# Patient Record
Sex: Male | Born: 1985 | Race: Black or African American | Hispanic: No | Marital: Single | Smoking: Never smoker
Health system: Southern US, Community
[De-identification: ages and names within clinical notes are randomized; demographics above are authoritative.]

## PROBLEM LIST (undated history)

## (undated) DIAGNOSIS — K5792 Diverticulitis of intestine, part unspecified, without perforation or abscess without bleeding: Secondary | ICD-10-CM

## (undated) DIAGNOSIS — N200 Calculus of kidney: Secondary | ICD-10-CM

## (undated) DIAGNOSIS — N289 Disorder of kidney and ureter, unspecified: Secondary | ICD-10-CM

## (undated) DIAGNOSIS — K5790 Diverticulosis of intestine, part unspecified, without perforation or abscess without bleeding: Secondary | ICD-10-CM

## (undated) HISTORY — PX: CHOLECYSTECTOMY: SHX55

## (undated) HISTORY — PX: APPENDECTOMY: SHX54

---

## 2008-12-24 ENCOUNTER — Emergency Department (HOSPITAL_COMMUNITY): Admission: EM | Admit: 2008-12-24 | Discharge: 2008-12-25 | Payer: Self-pay | Admitting: Emergency Medicine

## 2009-12-29 ENCOUNTER — Emergency Department (HOSPITAL_COMMUNITY): Admission: EM | Admit: 2009-12-29 | Discharge: 2009-12-30 | Payer: Self-pay | Admitting: Emergency Medicine

## 2010-05-03 ENCOUNTER — Emergency Department (HOSPITAL_COMMUNITY): Admission: EM | Admit: 2010-05-03 | Discharge: 2010-05-03 | Payer: Self-pay | Admitting: Emergency Medicine

## 2010-05-08 ENCOUNTER — Emergency Department (HOSPITAL_COMMUNITY): Admission: EM | Admit: 2010-05-08 | Discharge: 2010-05-09 | Payer: Self-pay | Admitting: Emergency Medicine

## 2010-05-10 ENCOUNTER — Emergency Department (HOSPITAL_COMMUNITY): Admission: EM | Admit: 2010-05-10 | Discharge: 2010-05-10 | Payer: Self-pay | Admitting: Emergency Medicine

## 2010-05-24 ENCOUNTER — Emergency Department (HOSPITAL_COMMUNITY): Admission: EM | Admit: 2010-05-24 | Discharge: 2010-05-24 | Payer: Self-pay | Admitting: Emergency Medicine

## 2010-05-26 ENCOUNTER — Emergency Department: Payer: Self-pay | Admitting: Emergency Medicine

## 2010-05-26 ENCOUNTER — Emergency Department (HOSPITAL_COMMUNITY): Admission: EM | Admit: 2010-05-26 | Discharge: 2010-05-26 | Payer: Self-pay | Admitting: Emergency Medicine

## 2010-06-11 ENCOUNTER — Emergency Department: Payer: Self-pay | Admitting: Emergency Medicine

## 2010-07-13 ENCOUNTER — Emergency Department (HOSPITAL_COMMUNITY): Admission: EM | Admit: 2010-07-13 | Discharge: 2010-07-13 | Payer: Self-pay | Admitting: Emergency Medicine

## 2010-07-19 ENCOUNTER — Emergency Department: Payer: Self-pay | Admitting: Unknown Physician Specialty

## 2010-07-24 ENCOUNTER — Emergency Department (HOSPITAL_COMMUNITY): Admission: EM | Admit: 2010-07-24 | Discharge: 2010-07-24 | Payer: Self-pay | Admitting: Emergency Medicine

## 2010-08-02 ENCOUNTER — Emergency Department (HOSPITAL_COMMUNITY)
Admission: EM | Admit: 2010-08-02 | Discharge: 2010-08-02 | Payer: Self-pay | Source: Home / Self Care | Admitting: Emergency Medicine

## 2010-08-10 ENCOUNTER — Emergency Department (HOSPITAL_COMMUNITY)
Admission: EM | Admit: 2010-08-10 | Discharge: 2010-08-10 | Payer: Self-pay | Source: Home / Self Care | Admitting: Emergency Medicine

## 2010-08-11 ENCOUNTER — Emergency Department (HOSPITAL_COMMUNITY): Admission: EM | Admit: 2010-08-11 | Discharge: 2010-08-11 | Payer: Self-pay | Admitting: Emergency Medicine

## 2010-08-16 ENCOUNTER — Emergency Department (HOSPITAL_COMMUNITY): Admission: EM | Admit: 2010-08-16 | Discharge: 2010-08-16 | Payer: Self-pay | Admitting: Emergency Medicine

## 2010-08-31 ENCOUNTER — Emergency Department: Payer: Self-pay | Admitting: Emergency Medicine

## 2010-09-21 ENCOUNTER — Emergency Department (HOSPITAL_COMMUNITY): Admission: EM | Admit: 2010-09-21 | Discharge: 2010-09-21 | Payer: Self-pay | Admitting: Emergency Medicine

## 2010-10-26 ENCOUNTER — Emergency Department (HOSPITAL_COMMUNITY)
Admission: EM | Admit: 2010-10-26 | Discharge: 2010-10-26 | Payer: Self-pay | Source: Home / Self Care | Admitting: Emergency Medicine

## 2010-11-22 ENCOUNTER — Emergency Department: Payer: Self-pay | Admitting: Emergency Medicine

## 2010-11-28 ENCOUNTER — Emergency Department (HOSPITAL_COMMUNITY)
Admission: EM | Admit: 2010-11-28 | Discharge: 2010-11-28 | Payer: Self-pay | Source: Home / Self Care | Admitting: Emergency Medicine

## 2010-12-04 LAB — DIFFERENTIAL
Basophils Absolute: 0 10*3/uL (ref 0.0–0.1)
Basophils Relative: 0 % (ref 0–1)
Eosinophils Absolute: 0.1 10*3/uL (ref 0.0–0.7)
Eosinophils Relative: 1 % (ref 0–5)
Lymphocytes Relative: 40 % (ref 12–46)
Lymphs Abs: 2.1 10*3/uL (ref 0.7–4.0)
Monocytes Absolute: 0.5 10*3/uL (ref 0.1–1.0)
Monocytes Relative: 9 % (ref 3–12)
Neutro Abs: 2.6 10*3/uL (ref 1.7–7.7)
Neutrophils Relative %: 50 % (ref 43–77)

## 2010-12-04 LAB — COMPREHENSIVE METABOLIC PANEL
ALT: 22 U/L (ref 0–53)
AST: 21 U/L (ref 0–37)
Albumin: 4.2 g/dL (ref 3.5–5.2)
Alkaline Phosphatase: 70 U/L (ref 39–117)
BUN: 12 mg/dL (ref 6–23)
CO2: 24 mEq/L (ref 19–32)
Calcium: 9.4 mg/dL (ref 8.4–10.5)
Chloride: 107 mEq/L (ref 96–112)
Creatinine, Ser: 1.05 mg/dL (ref 0.4–1.5)
GFR calc Af Amer: >60 mL/min (ref >60)
GFR calc non Af Amer: >60 mL/min (ref >60)
Glucose, Bld: 107 mg/dL — ABNORMAL HIGH (ref 70–99)
Potassium: 4 mEq/L (ref 3.5–5.1)
Sodium: 139 mEq/L (ref 135–145)
Total Bilirubin: 0.7 mg/dL (ref 0.3–1.2)
Total Protein: 7.1 g/dL (ref 6.0–8.3)

## 2010-12-04 LAB — CBC
HCT: 40.8 % (ref 39.0–52.0)
Hemoglobin: 13.9 g/dL (ref 13.0–17.0)
MCH: 28.3 pg (ref 26.0–34.0)
MCHC: 34.1 g/dL (ref 30.0–36.0)
MCV: 82.9 fL (ref 78.0–100.0)
Platelets: 232 10*3/uL (ref 150–400)
RBC: 4.92 MIL/uL (ref 4.22–5.81)
RDW: 13 % (ref 11.5–15.5)
WBC: 5.3 10*3/uL (ref 4.0–10.5)

## 2010-12-04 LAB — LIPASE, BLOOD: Lipase: 29 U/L (ref 11–59)

## 2010-12-19 ENCOUNTER — Emergency Department: Payer: Self-pay | Admitting: Internal Medicine

## 2011-01-16 ENCOUNTER — Emergency Department (HOSPITAL_BASED_OUTPATIENT_CLINIC_OR_DEPARTMENT_OTHER)
Admission: EM | Admit: 2011-01-16 | Discharge: 2011-01-16 | Disposition: A | Discharge status: Home / Self Care | Payer: Self-pay | Attending: Emergency Medicine | Admitting: Emergency Medicine

## 2011-01-16 DIAGNOSIS — R10811 Right upper quadrant abdominal tenderness: Secondary | ICD-10-CM | POA: Insufficient documentation

## 2011-01-16 DIAGNOSIS — R1011 Right upper quadrant pain: Secondary | ICD-10-CM | POA: Insufficient documentation

## 2011-01-16 LAB — URINALYSIS, ROUTINE W REFLEX MICROSCOPIC
Bilirubin Urine: NEGATIVE
Hgb urine dipstick: NEGATIVE
Ketones, ur: NEGATIVE mg/dL
Nitrite: NEGATIVE
Protein, ur: NEGATIVE mg/dL
Specific Gravity, Urine: 1.027 (ref 1.005–1.030)
Urine Glucose, Fasting: NEGATIVE mg/dL
Urobilinogen, UA: 1 mg/dL (ref 0.0–1.0)
pH: 6.5 (ref 5.0–8.0)

## 2011-01-16 LAB — COMPREHENSIVE METABOLIC PANEL
ALT: 50 U/L (ref 0–53)
AST: 32 U/L (ref 0–37)
Albumin: 4.8 g/dL (ref 3.5–5.2)
Alkaline Phosphatase: 94 U/L (ref 39–117)
BUN: 16 mg/dL (ref 6–23)
CO2: 28 mEq/L (ref 19–32)
Calcium: 9.9 mg/dL (ref 8.4–10.5)
Chloride: 105 mEq/L (ref 96–112)
Creatinine, Ser: 1.1 mg/dL (ref 0.4–1.5)
GFR calc Af Amer: >60 mL/min (ref >60)
GFR calc non Af Amer: >60 mL/min (ref >60)
Glucose, Bld: 69 mg/dL — ABNORMAL LOW (ref 70–99)
Potassium: 4.1 mEq/L (ref 3.5–5.1)
Sodium: 147 mEq/L — ABNORMAL HIGH (ref 135–145)
Total Bilirubin: 0.9 mg/dL (ref 0.3–1.2)
Total Protein: 8.6 g/dL — ABNORMAL HIGH (ref 6.0–8.3)

## 2011-01-16 LAB — DIFFERENTIAL
Basophils Absolute: 0 10*3/uL (ref 0.0–0.1)
Basophils Relative: 0 % (ref 0–1)
Eosinophils Absolute: 0 10*3/uL (ref 0.0–0.7)
Eosinophils Relative: 0 % (ref 0–5)
Lymphocytes Relative: 21 % (ref 12–46)
Lymphs Abs: 1.6 10*3/uL (ref 0.7–4.0)
Monocytes Absolute: 0.9 10*3/uL (ref 0.1–1.0)
Monocytes Relative: 11 % (ref 3–12)
Neutro Abs: 5.4 10*3/uL (ref 1.7–7.7)
Neutrophils Relative %: 68 % (ref 43–77)

## 2011-01-16 LAB — CBC
HCT: 41.8 % (ref 39.0–52.0)
Hemoglobin: 14.5 g/dL (ref 13.0–17.0)
MCH: 27.9 pg (ref 26.0–34.0)
MCHC: 34.7 g/dL (ref 30.0–36.0)
MCV: 80.4 fL (ref 78.0–100.0)
Platelets: 293 10*3/uL (ref 150–400)
RBC: 5.2 MIL/uL (ref 4.22–5.81)
RDW: 12.6 % (ref 11.5–15.5)
WBC: 7.9 10*3/uL (ref 4.0–10.5)

## 2011-01-16 LAB — LIPASE, BLOOD: Lipase: 204 U/L (ref 23–300)

## 2011-01-30 LAB — DIFFERENTIAL
Basophils Absolute: 0 10*3/uL (ref 0.0–0.1)
Basophils Relative: 1 % (ref 0–1)
Eosinophils Absolute: 0.1 10*3/uL (ref 0.0–0.7)
Eosinophils Relative: 3 % (ref 0–5)
Lymphocytes Relative: 37 % (ref 12–46)
Lymphs Abs: 1.6 10*3/uL (ref 0.7–4.0)
Monocytes Absolute: 0.6 10*3/uL (ref 0.1–1.0)
Monocytes Relative: 14 % — ABNORMAL HIGH (ref 3–12)
Neutro Abs: 1.9 10*3/uL (ref 1.7–7.7)
Neutrophils Relative %: 45 % (ref 43–77)

## 2011-01-30 LAB — CBC
HCT: 42.9 % (ref 39.0–52.0)
Hemoglobin: 14.6 g/dL (ref 13.0–17.0)
MCH: 28.6 pg (ref 26.0–34.0)
MCHC: 34 g/dL (ref 30.0–36.0)
MCV: 84.1 fL (ref 78.0–100.0)
Platelets: 243 10*3/uL (ref 150–400)
RBC: 5.1 MIL/uL (ref 4.22–5.81)
RDW: 12.9 % (ref 11.5–15.5)
WBC: 4.2 10*3/uL (ref 4.0–10.5)

## 2011-01-30 LAB — COMPREHENSIVE METABOLIC PANEL
ALT: 88 U/L — ABNORMAL HIGH (ref 0–53)
AST: 54 U/L — ABNORMAL HIGH (ref 0–37)
Albumin: 4.2 g/dL (ref 3.5–5.2)
Alkaline Phosphatase: 75 U/L (ref 39–117)
BUN: 8 mg/dL (ref 6–23)
CO2: 29 mEq/L (ref 19–32)
Calcium: 9.5 mg/dL (ref 8.4–10.5)
Chloride: 103 mEq/L (ref 96–112)
Creatinine, Ser: 1.04 mg/dL (ref 0.4–1.5)
GFR calc Af Amer: >60 mL/min (ref >60)
GFR calc non Af Amer: >60 mL/min (ref >60)
Glucose, Bld: 88 mg/dL (ref 70–99)
Potassium: 4.3 mEq/L (ref 3.5–5.1)
Sodium: 139 mEq/L (ref 135–145)
Total Bilirubin: 0.8 mg/dL (ref 0.3–1.2)
Total Protein: 7.6 g/dL (ref 6.0–8.3)

## 2011-01-30 LAB — POCT CARDIAC MARKERS
CKMB, poc: <1 ng/mL — ABNORMAL LOW (ref 1.0–8.0)
Myoglobin, poc: 52.3 ng/mL (ref 12–200)

## 2011-01-30 LAB — LIPASE, BLOOD: Lipase: 31 U/L (ref 11–59)

## 2011-02-01 LAB — CBC
HCT: 41.2 % (ref 39.0–52.0)
HCT: 40.4 % (ref 39.0–52.0)
Hemoglobin: 14 g/dL (ref 13.0–17.0)
Hemoglobin: 15 g/dL (ref 13.0–17.0)
Hemoglobin: 13.8 g/dL (ref 13.0–17.0)
Hemoglobin: 14.2 g/dL (ref 13.0–17.0)
MCH: 29.1 pg (ref 26.0–34.0)
MCH: 28.7 pg (ref 26.0–34.0)
MCHC: 34.3 g/dL (ref 30.0–36.0)
MCHC: 34 g/dL (ref 30.0–36.0)
MCHC: 34.2 g/dL (ref 30.0–36.0)
MCV: 83.2 fL (ref 78.0–100.0)
MCV: 85.9 fL (ref 78.0–100.0)
MCV: 84 fL (ref 78.0–100.0)
Platelets: 325 10*3/uL (ref 150–400)
Platelets: 301 10*3/uL (ref 150–400)
Platelets: 205 10*3/uL (ref 150–400)
RBC: 4.95 MIL/uL (ref 4.22–5.81)
RBC: 4.81 MIL/uL (ref 4.22–5.81)
RBC: 4.94 MIL/uL (ref 4.22–5.81)
RDW: 12.8 % (ref 11.5–15.5)
RDW: 13.2 % (ref 11.5–15.5)
RDW: 12.4 % (ref 11.5–15.5)
WBC: 4.7 10*3/uL (ref 4.0–10.5)
WBC: 6.1 10*3/uL (ref 4.0–10.5)

## 2011-02-01 LAB — DIFFERENTIAL
Basophils Absolute: 0 10*3/uL (ref 0.0–0.1)
Basophils Relative: 0 % (ref 0–1)
Basophils Relative: 1 % (ref 0–1)
Eosinophils Absolute: 0.1 10*3/uL (ref 0.0–0.7)
Eosinophils Absolute: 0.1 10*3/uL (ref 0.0–0.7)
Eosinophils Relative: 1 % (ref 0–5)
Eosinophils Relative: 1 % (ref 0–5)
Lymphocytes Relative: 44 % (ref 12–46)
Lymphocytes Relative: 25 % (ref 12–46)
Lymphs Abs: 2 10*3/uL (ref 0.7–4.0)
Lymphs Abs: 1.5 10*3/uL (ref 0.7–4.0)
Lymphs Abs: 1.4 10*3/uL (ref 0.7–4.0)
Lymphs Abs: 1.6 10*3/uL (ref 0.7–4.0)
Monocytes Absolute: 0.4 10*3/uL (ref 0.1–1.0)
Monocytes Relative: 10 % (ref 3–12)
Monocytes Relative: 9 % (ref 3–12)
Neutro Abs: 2.3 10*3/uL (ref 1.7–7.7)
Neutro Abs: 4 10*3/uL (ref 1.7–7.7)
Neutro Abs: 2.2 10*3/uL (ref 1.7–7.7)
Neutrophils Relative %: 49 % (ref 43–77)
Neutrophils Relative %: 51 % (ref 43–77)

## 2011-02-01 LAB — URINALYSIS, ROUTINE W REFLEX MICROSCOPIC
Glucose, UA: NEGATIVE mg/dL
Hgb urine dipstick: NEGATIVE
Hgb urine dipstick: NEGATIVE
Ketones, ur: NEGATIVE mg/dL
Ketones, ur: NEGATIVE mg/dL
Ketones, ur: NEGATIVE mg/dL
Nitrite: NEGATIVE
Nitrite: NEGATIVE
Protein, ur: NEGATIVE mg/dL
Protein, ur: NEGATIVE mg/dL
Protein, ur: NEGATIVE mg/dL
Specific Gravity, Urine: 1.016 (ref 1.005–1.030)
Specific Gravity, Urine: 1.025 (ref 1.005–1.030)
Urobilinogen, UA: 0.2 mg/dL (ref 0.0–1.0)
Urobilinogen, UA: 0.2 mg/dL (ref 0.0–1.0)
pH: 6.5 (ref 5.0–8.0)
pH: 6.5 (ref 5.0–8.0)

## 2011-02-01 LAB — COMPREHENSIVE METABOLIC PANEL
ALT: 54 U/L — ABNORMAL HIGH (ref 0–53)
ALT: 35 U/L (ref 0–53)
AST: 39 U/L — ABNORMAL HIGH (ref 0–37)
AST: 27 U/L (ref 0–37)
AST: 26 U/L (ref 0–37)
Albumin: 4 g/dL (ref 3.5–5.2)
Albumin: 4.1 g/dL (ref 3.5–5.2)
Alkaline Phosphatase: 66 U/L (ref 39–117)
Alkaline Phosphatase: 62 U/L (ref 39–117)
BUN: 12 mg/dL (ref 6–23)
BUN: 9 mg/dL (ref 6–23)
BUN: 11 mg/dL (ref 6–23)
CO2: 26 mEq/L (ref 19–32)
CO2: 29 mEq/L (ref 19–32)
CO2: 25 mEq/L (ref 19–32)
CO2: 26 mEq/L (ref 19–32)
Calcium: 9.6 mg/dL (ref 8.4–10.5)
Calcium: 9.2 mg/dL (ref 8.4–10.5)
Calcium: 9.3 mg/dL (ref 8.4–10.5)
Chloride: 105 mEq/L (ref 96–112)
Chloride: 104 mEq/L (ref 96–112)
Creatinine, Ser: 1.08 mg/dL (ref 0.4–1.5)
GFR calc Af Amer: >60 mL/min (ref >60)
GFR calc Af Amer: >60 mL/min (ref >60)
GFR calc Af Amer: >60 mL/min (ref >60)
GFR calc non Af Amer: >60 mL/min (ref >60)
GFR calc non Af Amer: >60 mL/min (ref >60)
GFR calc non Af Amer: >60 mL/min (ref >60)
GFR calc non Af Amer: >60 mL/min (ref >60)
Glucose, Bld: 98 mg/dL (ref 70–99)
Glucose, Bld: 92 mg/dL (ref 70–99)
Potassium: 4.3 mEq/L (ref 3.5–5.1)
Potassium: 4.2 mEq/L (ref 3.5–5.1)
Sodium: 137 mEq/L (ref 135–145)
Sodium: 139 mEq/L (ref 135–145)
Total Bilirubin: 0.6 mg/dL (ref 0.3–1.2)
Total Bilirubin: 1 mg/dL (ref 0.3–1.2)
Total Protein: 7.4 g/dL (ref 6.0–8.3)
Total Protein: 7.3 g/dL (ref 6.0–8.3)
Total Protein: 7.6 g/dL (ref 6.0–8.3)

## 2011-02-01 LAB — LIPASE, BLOOD
Lipase: 58 U/L (ref 11–59)
Lipase: 29 U/L (ref 11–59)

## 2011-02-01 LAB — RAPID URINE DRUG SCREEN, HOSP PERFORMED
Amphetamines: NOT DETECTED
Barbiturates: NOT DETECTED

## 2011-02-02 LAB — URINALYSIS, ROUTINE W REFLEX MICROSCOPIC
Bilirubin Urine: NEGATIVE
Ketones, ur: NEGATIVE mg/dL
Nitrite: NEGATIVE
Protein, ur: NEGATIVE mg/dL
Urobilinogen, UA: 0.2 mg/dL (ref 0.0–1.0)
pH: 7.5 (ref 5.0–8.0)

## 2011-02-02 LAB — COMPREHENSIVE METABOLIC PANEL
Albumin: 4.2 g/dL (ref 3.5–5.2)
Alkaline Phosphatase: 65 U/L (ref 39–117)
BUN: 10 mg/dL (ref 6–23)
CO2: 30 mEq/L (ref 19–32)
Chloride: 105 mEq/L (ref 96–112)
GFR calc non Af Amer: >60 mL/min (ref >60)
Glucose, Bld: 97 mg/dL (ref 70–99)
Potassium: 4.2 mEq/L (ref 3.5–5.1)
Total Bilirubin: 0.8 mg/dL (ref 0.3–1.2)

## 2011-02-02 LAB — CBC
HCT: 41.5 % (ref 39.0–52.0)
Hemoglobin: 14 g/dL (ref 13.0–17.0)
MCV: 84.5 fL (ref 78.0–100.0)
Platelets: 196 10*3/uL (ref 150–400)
RBC: 4.91 MIL/uL (ref 4.22–5.81)
WBC: 5.1 10*3/uL (ref 4.0–10.5)

## 2011-02-02 LAB — DIFFERENTIAL
Basophils Absolute: 0 10*3/uL (ref 0.0–0.1)
Basophils Relative: 0 % (ref 0–1)
Monocytes Absolute: 0.4 10*3/uL (ref 0.1–1.0)
Neutro Abs: 3.5 10*3/uL (ref 1.7–7.7)

## 2011-02-04 LAB — COMPREHENSIVE METABOLIC PANEL
ALT: 29 U/L (ref 0–53)
ALT: 32 U/L (ref 0–53)
AST: 20 U/L (ref 0–37)
Albumin: 3.9 g/dL (ref 3.5–5.2)
Albumin: 4.1 g/dL (ref 3.5–5.2)
Albumin: 4.3 g/dL (ref 3.5–5.2)
Alkaline Phosphatase: 64 U/L (ref 39–117)
Alkaline Phosphatase: 69 U/L (ref 39–117)
Alkaline Phosphatase: 70 U/L (ref 39–117)
BUN: 10 mg/dL (ref 6–23)
BUN: 12 mg/dL (ref 6–23)
BUN: 7 mg/dL (ref 6–23)
CO2: 31 mEq/L (ref 19–32)
Calcium: 9.2 mg/dL (ref 8.4–10.5)
Calcium: 9.7 mg/dL (ref 8.4–10.5)
Calcium: 9.7 mg/dL (ref 8.4–10.5)
Chloride: 102 mEq/L (ref 96–112)
Creatinine, Ser: 1.11 mg/dL (ref 0.4–1.5)
Creatinine, Ser: 1.17 mg/dL (ref 0.4–1.5)
Creatinine, Ser: 1.2 mg/dL (ref 0.4–1.5)
GFR calc Af Amer: >60 mL/min (ref >60)
GFR calc non Af Amer: >60 mL/min (ref >60)
Glucose, Bld: 91 mg/dL (ref 70–99)
Potassium: 3.8 mEq/L (ref 3.5–5.1)
Potassium: 4.1 mEq/L (ref 3.5–5.1)
Potassium: 4.6 mEq/L (ref 3.5–5.1)
Sodium: 141 mEq/L (ref 135–145)
Total Bilirubin: 0.7 mg/dL (ref 0.3–1.2)
Total Protein: 7 g/dL (ref 6.0–8.3)
Total Protein: 7.5 g/dL (ref 6.0–8.3)
Total Protein: 7.8 g/dL (ref 6.0–8.3)

## 2011-02-04 LAB — POCT CARDIAC MARKERS
Myoglobin, poc: 33.2 ng/mL (ref 12–200)
Myoglobin, poc: 35.6 ng/mL (ref 12–200)
Troponin i, poc: <0.05 ng/mL (ref 0.00–0.09)

## 2011-02-04 LAB — URINALYSIS, ROUTINE W REFLEX MICROSCOPIC
Bilirubin Urine: NEGATIVE
Bilirubin Urine: NEGATIVE
Bilirubin Urine: NEGATIVE
Glucose, UA: NEGATIVE mg/dL
Glucose, UA: NEGATIVE mg/dL
Hgb urine dipstick: NEGATIVE
Hgb urine dipstick: NEGATIVE
Hgb urine dipstick: NEGATIVE
Ketones, ur: NEGATIVE mg/dL
Ketones, ur: NEGATIVE mg/dL
Nitrite: NEGATIVE
Protein, ur: NEGATIVE mg/dL
Protein, ur: NEGATIVE mg/dL
Protein, ur: NEGATIVE mg/dL
Specific Gravity, Urine: 1.023 (ref 1.005–1.030)
Urobilinogen, UA: 1 mg/dL (ref 0.0–1.0)
Urobilinogen, UA: 0.2 mg/dL (ref 0.0–1.0)
pH: 6.5 (ref 5.0–8.0)

## 2011-02-04 LAB — DIFFERENTIAL
Basophils Absolute: 0 10*3/uL (ref 0.0–0.1)
Basophils Absolute: 0 10*3/uL (ref 0.0–0.1)
Basophils Absolute: 0 10*3/uL (ref 0.0–0.1)
Basophils Relative: 0 % (ref 0–1)
Basophils Relative: 0 % (ref 0–1)
Basophils Relative: 1 % (ref 0–1)
Eosinophils Absolute: 0.1 10*3/uL (ref 0.0–0.7)
Eosinophils Relative: 3 % (ref 0–5)
Lymphocytes Relative: 20 % (ref 12–46)
Lymphocytes Relative: 33 % (ref 12–46)
Lymphocytes Relative: 41 % (ref 12–46)
Lymphs Abs: 1.8 10*3/uL (ref 0.7–4.0)
Monocytes Absolute: 0.4 10*3/uL (ref 0.1–1.0)
Monocytes Absolute: 0.7 10*3/uL (ref 0.1–1.0)
Monocytes Relative: 10 % (ref 3–12)
Monocytes Relative: 14 % — ABNORMAL HIGH (ref 3–12)
Monocytes Relative: 8 % (ref 3–12)
Neutro Abs: 1.8 10*3/uL (ref 1.7–7.7)
Neutro Abs: 2.2 10*3/uL (ref 1.7–7.7)
Neutro Abs: 4.5 10*3/uL (ref 1.7–7.7)
Neutrophils Relative %: 46 % (ref 43–77)
Neutrophils Relative %: 72 % (ref 43–77)

## 2011-02-04 LAB — CBC
HCT: 43.8 % (ref 39.0–52.0)
Hemoglobin: 14.2 g/dL (ref 13.0–17.0)
MCH: 28.8 pg (ref 26.0–34.0)
MCHC: 32.4 g/dL (ref 30.0–36.0)
MCHC: 34 g/dL (ref 30.0–36.0)
MCV: 85.8 fL (ref 78.0–100.0)
MCV: 86.1 fL (ref 78.0–100.0)
MCV: 86.3 fL (ref 78.0–100.0)
Platelets: 223 10*3/uL (ref 150–400)
Platelets: 225 10*3/uL (ref 150–400)
Platelets: 234 10*3/uL (ref 150–400)
RBC: 5.2 MIL/uL (ref 4.22–5.81)
RDW: 12.9 % (ref 11.5–15.5)
RDW: 12.9 % (ref 11.5–15.5)
RDW: 13.2 % (ref 11.5–15.5)
WBC: 3.9 10*3/uL — ABNORMAL LOW (ref 4.0–10.5)
WBC: 4.7 10*3/uL (ref 4.0–10.5)

## 2011-02-07 LAB — DIFFERENTIAL
Basophils Relative: 1 % (ref 0–1)
Eosinophils Absolute: 0 10*3/uL (ref 0.0–0.7)
Eosinophils Relative: 1 % (ref 0–5)
Lymphs Abs: 2.1 10*3/uL (ref 0.7–4.0)
Monocytes Absolute: 0.5 10*3/uL (ref 0.1–1.0)
Monocytes Relative: 9 % (ref 3–12)

## 2011-02-07 LAB — URINALYSIS, ROUTINE W REFLEX MICROSCOPIC
Glucose, UA: NEGATIVE mg/dL
Hgb urine dipstick: NEGATIVE
Ketones, ur: NEGATIVE mg/dL
pH: 6 (ref 5.0–8.0)

## 2011-02-07 LAB — BASIC METABOLIC PANEL
CO2: 30 mEq/L (ref 19–32)
Chloride: 105 mEq/L (ref 96–112)
GFR calc Af Amer: >60 mL/min (ref >60)
Potassium: 3.8 mEq/L (ref 3.5–5.1)

## 2011-02-07 LAB — CBC
HCT: 43.3 % (ref 39.0–52.0)
MCHC: 34.3 g/dL (ref 30.0–36.0)
MCV: 85.7 fL (ref 78.0–100.0)
RBC: 5.05 MIL/uL (ref 4.22–5.81)
WBC: 5.2 10*3/uL (ref 4.0–10.5)

## 2011-03-02 ENCOUNTER — Emergency Department (HOSPITAL_COMMUNITY)
Admission: EM | Admit: 2011-03-02 | Discharge: 2011-03-02 | Disposition: A | Discharge status: Home / Self Care | Payer: PRIVATE HEALTH INSURANCE | Attending: Emergency Medicine | Admitting: Emergency Medicine

## 2011-03-02 ENCOUNTER — Emergency Department (HOSPITAL_COMMUNITY): Payer: PRIVATE HEALTH INSURANCE

## 2011-03-02 DIAGNOSIS — R0789 Other chest pain: Secondary | ICD-10-CM | POA: Insufficient documentation

## 2011-03-02 DIAGNOSIS — R1011 Right upper quadrant pain: Secondary | ICD-10-CM | POA: Insufficient documentation

## 2011-03-02 LAB — COMPREHENSIVE METABOLIC PANEL
AST: 38 U/L — ABNORMAL HIGH (ref 0–37)
CO2: 27 mEq/L (ref 19–32)
Calcium: 8.8 mg/dL (ref 8.4–10.5)
Creatinine, Ser: 1.11 mg/dL (ref 0.4–1.5)
GFR calc Af Amer: >60 mL/min (ref >60)
GFR calc non Af Amer: >60 mL/min (ref >60)
Total Protein: 7.4 g/dL (ref 6.0–8.3)

## 2011-03-02 LAB — DIFFERENTIAL
Basophils Relative: 1 % (ref 0–1)
Eosinophils Absolute: 0 10*3/uL (ref 0.0–0.7)
Eosinophils Relative: 1 % (ref 0–5)
Monocytes Absolute: 0.4 10*3/uL (ref 0.1–1.0)
Monocytes Relative: 9 % (ref 3–12)

## 2011-03-02 LAB — CBC
Hemoglobin: 14.2 g/dL (ref 13.0–17.0)
MCH: 28.5 pg (ref 26.0–34.0)
MCHC: 33.6 g/dL (ref 30.0–36.0)
Platelets: 263 10*3/uL (ref 150–400)
RDW: 13.5 % (ref 11.5–15.5)

## 2011-03-02 LAB — LIPASE, BLOOD: Lipase: 35 U/L (ref 11–59)

## 2011-03-06 ENCOUNTER — Emergency Department (HOSPITAL_COMMUNITY)
Admission: EM | Admit: 2011-03-06 | Discharge: 2011-03-06 | Disposition: A | Discharge status: Home / Self Care | Payer: PRIVATE HEALTH INSURANCE | Attending: Emergency Medicine | Admitting: Emergency Medicine

## 2011-03-06 ENCOUNTER — Emergency Department: Payer: Self-pay | Admitting: Unknown Physician Specialty

## 2011-03-06 DIAGNOSIS — R1031 Right lower quadrant pain: Secondary | ICD-10-CM | POA: Insufficient documentation

## 2011-03-06 DIAGNOSIS — N390 Urinary tract infection, site not specified: Secondary | ICD-10-CM | POA: Insufficient documentation

## 2011-03-06 LAB — URINE MICROSCOPIC-ADD ON

## 2011-03-06 LAB — DIFFERENTIAL
Basophils Absolute: 0 10*3/uL (ref 0.0–0.1)
Basophils Absolute: 0 10*3/uL (ref 0.0–0.1)
Eosinophils Absolute: 0.1 10*3/uL (ref 0.0–0.7)
Lymphocytes Relative: 45 % (ref 12–46)
Lymphs Abs: 1.8 10*3/uL (ref 0.7–4.0)
Monocytes Absolute: 0.3 10*3/uL (ref 0.1–1.0)
Neutro Abs: 1.9 10*3/uL (ref 1.7–7.7)
Neutrophils Relative %: 55 % (ref 43–77)

## 2011-03-06 LAB — BASIC METABOLIC PANEL
CO2: 28 mEq/L (ref 19–32)
Glucose, Bld: 99 mg/dL (ref 70–99)
Potassium: 3.8 mEq/L (ref 3.5–5.1)
Sodium: 139 mEq/L (ref 135–145)

## 2011-03-06 LAB — CBC
HCT: 44.9 % (ref 39.0–52.0)
Hemoglobin: 15 g/dL (ref 13.0–17.0)
MCV: 86 fL (ref 78.0–100.0)
Platelets: 254 10*3/uL (ref 150–400)
RDW: 13.1 % (ref 11.5–15.5)
WBC: 4.1 10*3/uL (ref 4.0–10.5)
WBC: 5.1 10*3/uL (ref 4.0–10.5)

## 2011-03-06 LAB — URINALYSIS, ROUTINE W REFLEX MICROSCOPIC
Bilirubin Urine: NEGATIVE
Hgb urine dipstick: NEGATIVE
Hgb urine dipstick: NEGATIVE
Protein, ur: NEGATIVE mg/dL
Specific Gravity, Urine: 1.021 (ref 1.005–1.030)
Urobilinogen, UA: 1 mg/dL (ref 0.0–1.0)
pH: 6.5 (ref 5.0–8.0)

## 2011-03-06 LAB — POCT I-STAT, CHEM 8
Creatinine, Ser: 1.2 mg/dL (ref 0.4–1.5)
HCT: 52 % (ref 39.0–52.0)
Hemoglobin: 17.7 g/dL — ABNORMAL HIGH (ref 13.0–17.0)
Potassium: 4 mEq/L (ref 3.5–5.1)
Sodium: 140 mEq/L (ref 135–145)

## 2011-03-20 ENCOUNTER — Emergency Department (HOSPITAL_BASED_OUTPATIENT_CLINIC_OR_DEPARTMENT_OTHER)
Admission: EM | Admit: 2011-03-20 | Discharge: 2011-03-20 | Disposition: A | Discharge status: Home / Self Care | Payer: PRIVATE HEALTH INSURANCE | Attending: Emergency Medicine | Admitting: Emergency Medicine

## 2011-03-20 ENCOUNTER — Emergency Department (INDEPENDENT_AMBULATORY_CARE_PROVIDER_SITE_OTHER): Payer: PRIVATE HEALTH INSURANCE

## 2011-03-20 DIAGNOSIS — R0789 Other chest pain: Secondary | ICD-10-CM | POA: Insufficient documentation

## 2011-03-20 DIAGNOSIS — R079 Chest pain, unspecified: Secondary | ICD-10-CM

## 2011-03-20 DIAGNOSIS — R0602 Shortness of breath: Secondary | ICD-10-CM

## 2011-04-20 ENCOUNTER — Emergency Department (HOSPITAL_BASED_OUTPATIENT_CLINIC_OR_DEPARTMENT_OTHER)
Admission: EM | Admit: 2011-04-20 | Discharge: 2011-04-20 | Discharge status: Against Medical Advice | Payer: Self-pay | Attending: Emergency Medicine | Admitting: Emergency Medicine

## 2011-04-20 DIAGNOSIS — R109 Unspecified abdominal pain: Secondary | ICD-10-CM | POA: Insufficient documentation

## 2011-05-01 ENCOUNTER — Emergency Department: Payer: Self-pay | Admitting: Emergency Medicine

## 2011-05-02 ENCOUNTER — Emergency Department (HOSPITAL_COMMUNITY)
Admission: EM | Admit: 2011-05-02 | Discharge: 2011-05-02 | Disposition: A | Discharge status: Home / Self Care | Payer: PRIVATE HEALTH INSURANCE | Attending: Emergency Medicine | Admitting: Emergency Medicine

## 2011-05-02 DIAGNOSIS — R1012 Left upper quadrant pain: Secondary | ICD-10-CM | POA: Insufficient documentation

## 2011-05-02 DIAGNOSIS — R112 Nausea with vomiting, unspecified: Secondary | ICD-10-CM | POA: Insufficient documentation

## 2011-05-02 DIAGNOSIS — R071 Chest pain on breathing: Secondary | ICD-10-CM | POA: Insufficient documentation

## 2011-05-02 LAB — HEPATIC FUNCTION PANEL
ALT: 38 U/L (ref 0–53)
AST: 28 U/L (ref 0–37)
Alkaline Phosphatase: 97 U/L (ref 39–117)
Bilirubin, Direct: 0.1 mg/dL (ref 0.0–0.3)
Indirect Bilirubin: 0.2 mg/dL — ABNORMAL LOW (ref 0.3–0.9)

## 2011-05-02 LAB — URINALYSIS, ROUTINE W REFLEX MICROSCOPIC
Glucose, UA: NEGATIVE mg/dL
Hgb urine dipstick: NEGATIVE
Leukocytes, UA: NEGATIVE
Specific Gravity, Urine: 1.025 (ref 1.005–1.030)
Urobilinogen, UA: 0.2 mg/dL (ref 0.0–1.0)

## 2011-05-02 LAB — BASIC METABOLIC PANEL
BUN: 15 mg/dL (ref 6–23)
Chloride: 100 mEq/L (ref 96–112)
Glucose, Bld: 88 mg/dL (ref 70–99)
Potassium: 4.1 mEq/L (ref 3.5–5.1)
Sodium: 138 mEq/L (ref 135–145)

## 2011-05-02 LAB — CBC
HCT: 42.2 % (ref 39.0–52.0)
Hemoglobin: 14.1 g/dL (ref 13.0–17.0)
RBC: 5.07 MIL/uL (ref 4.22–5.81)
WBC: 5 10*3/uL (ref 4.0–10.5)

## 2011-05-02 LAB — DIFFERENTIAL
Basophils Absolute: 0 10*3/uL (ref 0.0–0.1)
Basophils Relative: 0 % (ref 0–1)
Lymphocytes Relative: 50 % — ABNORMAL HIGH (ref 12–46)
Neutro Abs: 2 10*3/uL (ref 1.7–7.7)
Neutrophils Relative %: 39 % — ABNORMAL LOW (ref 43–77)

## 2011-05-02 LAB — RAPID URINE DRUG SCREEN, HOSP PERFORMED
Benzodiazepines: NOT DETECTED
Cocaine: NOT DETECTED
Opiates: POSITIVE — AB

## 2011-06-13 ENCOUNTER — Emergency Department (HOSPITAL_COMMUNITY): Payer: PRIVATE HEALTH INSURANCE

## 2011-06-13 ENCOUNTER — Emergency Department (HOSPITAL_COMMUNITY)
Admission: EM | Admit: 2011-06-13 | Discharge: 2011-06-13 | Disposition: A | Discharge status: Home / Self Care | Payer: PRIVATE HEALTH INSURANCE | Attending: Emergency Medicine | Admitting: Emergency Medicine

## 2011-06-13 DIAGNOSIS — R109 Unspecified abdominal pain: Secondary | ICD-10-CM | POA: Insufficient documentation

## 2011-06-13 DIAGNOSIS — R112 Nausea with vomiting, unspecified: Secondary | ICD-10-CM | POA: Insufficient documentation

## 2011-06-13 LAB — URINALYSIS, ROUTINE W REFLEX MICROSCOPIC
Bilirubin Urine: NEGATIVE
Leukocytes, UA: NEGATIVE
Nitrite: NEGATIVE
Specific Gravity, Urine: 1.028 (ref 1.005–1.030)
Urobilinogen, UA: 0.2 mg/dL (ref 0.0–1.0)
pH: 6.5 (ref 5.0–8.0)

## 2011-06-13 LAB — COMPREHENSIVE METABOLIC PANEL
AST: 22 U/L (ref 0–37)
Albumin: 4.1 g/dL (ref 3.5–5.2)
BUN: 12 mg/dL (ref 6–23)
Chloride: 102 mEq/L (ref 96–112)
Creatinine, Ser: 1.04 mg/dL (ref 0.50–1.35)
Potassium: 3.7 mEq/L (ref 3.5–5.1)
Total Bilirubin: 0.3 mg/dL (ref 0.3–1.2)
Total Protein: 7.4 g/dL (ref 6.0–8.3)

## 2011-06-13 LAB — DIFFERENTIAL
Basophils Absolute: 0 10*3/uL (ref 0.0–0.1)
Eosinophils Absolute: 0.1 10*3/uL (ref 0.0–0.7)
Eosinophils Relative: 2 % (ref 0–5)
Monocytes Absolute: 0.3 10*3/uL (ref 0.1–1.0)

## 2011-06-13 LAB — CBC
MCHC: 34.7 g/dL (ref 30.0–36.0)
MCV: 82.2 fL (ref 78.0–100.0)
Platelets: 250 10*3/uL (ref 150–400)
RDW: 13.5 % (ref 11.5–15.5)
WBC: 3.6 10*3/uL — ABNORMAL LOW (ref 4.0–10.5)

## 2011-06-13 LAB — PROTIME-INR
INR: 1.02 (ref 0.00–1.49)
Prothrombin Time: 13.6 seconds (ref 11.6–15.2)

## 2011-06-13 LAB — LIPASE, BLOOD: Lipase: 43 U/L (ref 11–59)

## 2011-06-13 MED ORDER — IOHEXOL 300 MG/ML  SOLN
80.0000 mL | Freq: Once | INTRAMUSCULAR | Status: AC | PRN
  Administered 2011-06-13: 80 mL via INTRAVENOUS

## 2011-07-09 ENCOUNTER — Emergency Department: Payer: Self-pay | Admitting: Emergency Medicine

## 2011-07-20 ENCOUNTER — Encounter (HOSPITAL_COMMUNITY): Payer: Self-pay | Admitting: Radiology

## 2011-07-20 ENCOUNTER — Emergency Department (HOSPITAL_COMMUNITY)
Admission: EM | Admit: 2011-07-20 | Discharge: 2011-07-20 | Disposition: A | Discharge status: Home / Self Care | Payer: Self-pay | Attending: Emergency Medicine | Admitting: Emergency Medicine

## 2011-07-20 ENCOUNTER — Emergency Department (HOSPITAL_COMMUNITY): Payer: Self-pay

## 2011-07-20 DIAGNOSIS — R109 Unspecified abdominal pain: Secondary | ICD-10-CM | POA: Insufficient documentation

## 2011-07-20 DIAGNOSIS — N200 Calculus of kidney: Secondary | ICD-10-CM | POA: Insufficient documentation

## 2011-07-20 LAB — URINALYSIS, ROUTINE W REFLEX MICROSCOPIC
Glucose, UA: NEGATIVE mg/dL
Ketones, ur: NEGATIVE mg/dL
Leukocytes, UA: NEGATIVE
Nitrite: NEGATIVE
Protein, ur: NEGATIVE mg/dL
Urobilinogen, UA: 1 mg/dL (ref 0.0–1.0)

## 2011-07-31 ENCOUNTER — Emergency Department (HOSPITAL_COMMUNITY)
Admission: EM | Admit: 2011-07-31 | Discharge: 2011-08-01 | Discharge status: Against Medical Advice | Payer: BC Managed Care – PPO | Attending: Emergency Medicine | Admitting: Emergency Medicine

## 2011-07-31 DIAGNOSIS — R109 Unspecified abdominal pain: Secondary | ICD-10-CM | POA: Insufficient documentation

## 2011-07-31 DIAGNOSIS — R1110 Vomiting, unspecified: Secondary | ICD-10-CM | POA: Insufficient documentation

## 2011-07-31 DIAGNOSIS — R079 Chest pain, unspecified: Secondary | ICD-10-CM | POA: Insufficient documentation

## 2011-08-01 ENCOUNTER — Emergency Department (HOSPITAL_COMMUNITY)
Admission: EM | Admit: 2011-08-01 | Discharge: 2011-08-01 | Discharge status: Against Medical Advice | Payer: BC Managed Care – PPO | Attending: Emergency Medicine | Admitting: Emergency Medicine

## 2011-08-01 ENCOUNTER — Emergency Department: Payer: Self-pay | Admitting: Emergency Medicine

## 2011-08-01 ENCOUNTER — Emergency Department (HOSPITAL_COMMUNITY): Payer: BC Managed Care – PPO

## 2011-08-01 DIAGNOSIS — R112 Nausea with vomiting, unspecified: Secondary | ICD-10-CM | POA: Insufficient documentation

## 2011-08-01 DIAGNOSIS — R1032 Left lower quadrant pain: Secondary | ICD-10-CM | POA: Insufficient documentation

## 2011-08-01 LAB — CBC
MCV: 83.7 fL (ref 78.0–100.0)
Platelets: 238 10*3/uL (ref 150–400)
RBC: 4.85 MIL/uL (ref 4.22–5.81)
RDW: 13.5 % (ref 11.5–15.5)
WBC: 3.3 10*3/uL — ABNORMAL LOW (ref 4.0–10.5)

## 2011-08-01 LAB — DIFFERENTIAL
Basophils Relative: 0 % (ref 0–1)
Eosinophils Absolute: 0.1 10*3/uL (ref 0.0–0.7)
Eosinophils Relative: 2 % (ref 0–5)
Lymphs Abs: 1.5 10*3/uL (ref 0.7–4.0)
Neutrophils Relative %: 41 % — ABNORMAL LOW (ref 43–77)

## 2011-08-01 LAB — COMPREHENSIVE METABOLIC PANEL
ALT: 34 U/L (ref 0–53)
AST: 38 U/L — ABNORMAL HIGH (ref 0–37)
Albumin: 4 g/dL (ref 3.5–5.2)
Calcium: 9.1 mg/dL (ref 8.4–10.5)
Chloride: 103 mEq/L (ref 96–112)
Creatinine, Ser: 0.96 mg/dL (ref 0.50–1.35)
Sodium: 137 mEq/L (ref 135–145)
Total Bilirubin: 0.4 mg/dL (ref 0.3–1.2)

## 2011-08-04 IMAGING — CR DG ABDOMEN ACUTE W/ 1V CHEST
4 series · 4 of 4 positions shown · non-contrast
Comparison: Multiple prior examinations this year.

CLINICAL DATA: Right-sided abdominal pain.  Nausea, vomiting,
diarrhea.

ACUTE ABDOMEN SERIES (ABDOMEN 2 VIEW & CHEST 1 VIEW)

[w chest pa]
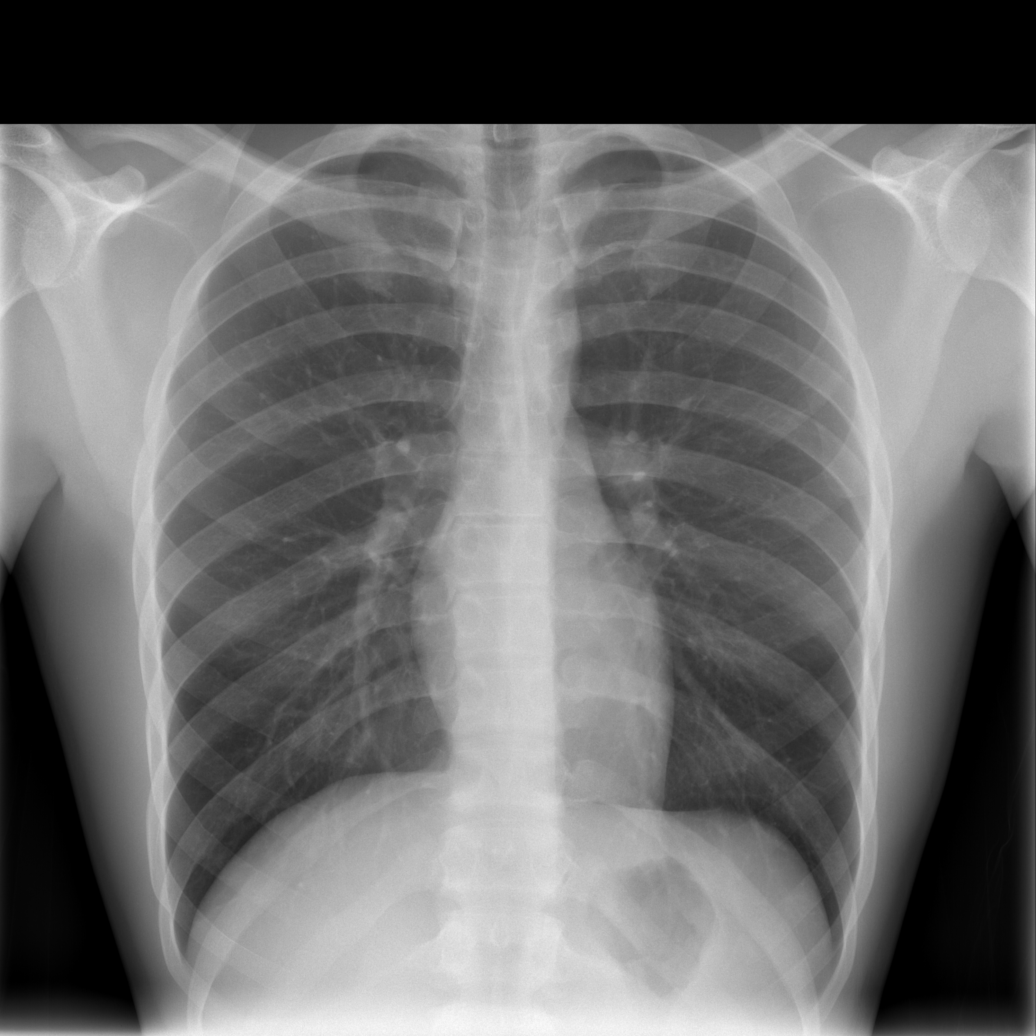

[w abdomen upright *]
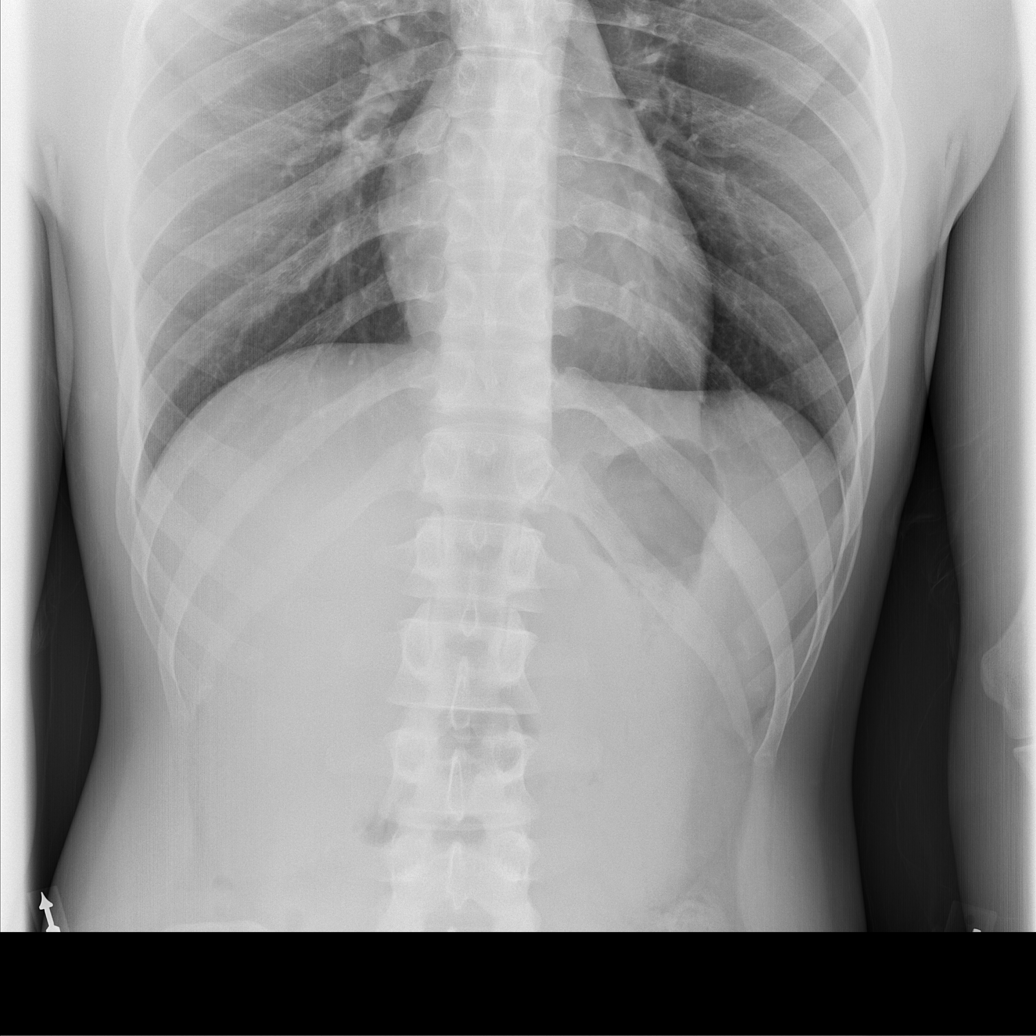

[t abdomen supine (1 of 2)]
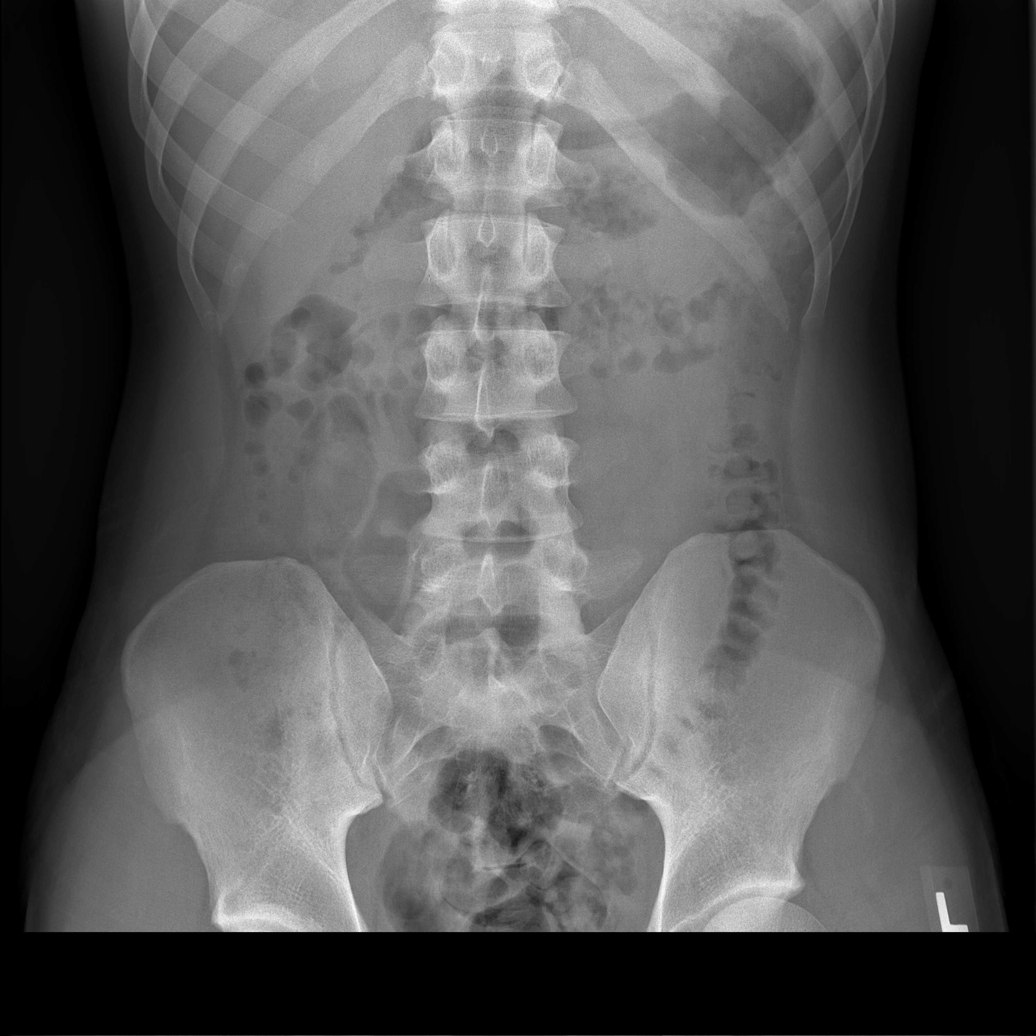

[t abdomen supine (2 of 2)]
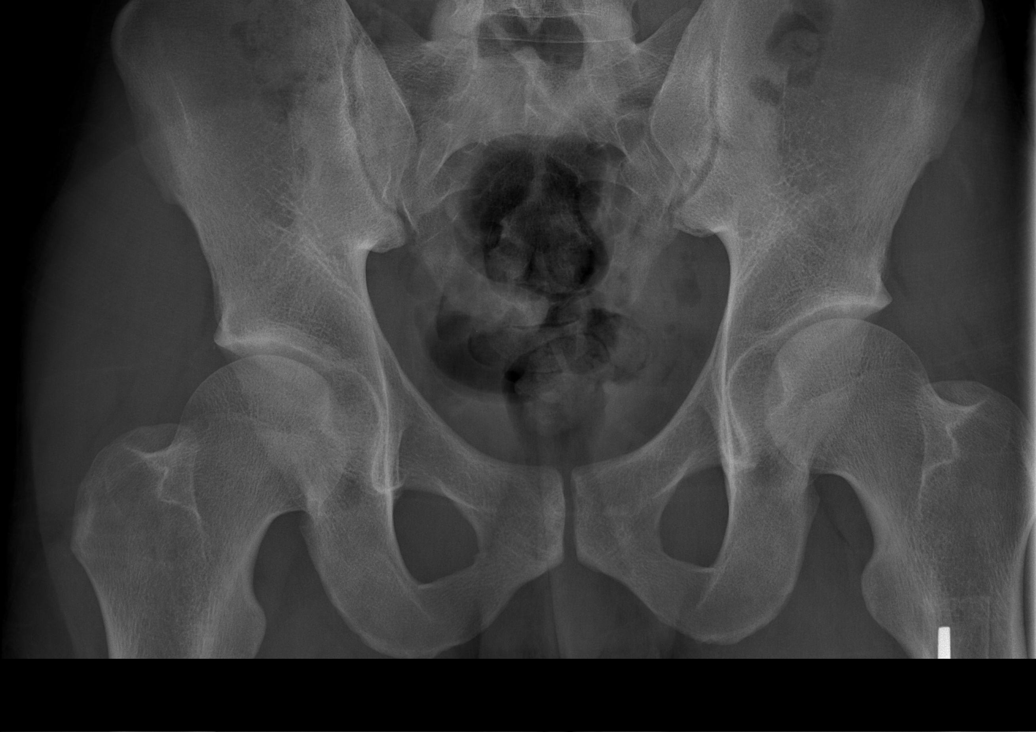

[4 of 4 positions shown; findings below may reference images not displayed]

FINDINGS: Bowel gas pattern is normal without evidence of ileus,
obstruction or free air.  No abnormal calcifications or bony
findings.

One-view chest shows normal heart and mediastinal shadows.  Lungs
are clear.  No free air.
IMPRESSION: Normal acute abdominal series.

## 2011-09-12 ENCOUNTER — Encounter (HOSPITAL_BASED_OUTPATIENT_CLINIC_OR_DEPARTMENT_OTHER): Payer: Self-pay

## 2011-09-12 ENCOUNTER — Emergency Department (HOSPITAL_BASED_OUTPATIENT_CLINIC_OR_DEPARTMENT_OTHER)
Admission: EM | Admit: 2011-09-12 | Discharge: 2011-09-13 | Discharge status: Against Medical Advice | Payer: BC Managed Care – PPO | Attending: Emergency Medicine | Admitting: Emergency Medicine

## 2011-09-12 DIAGNOSIS — R109 Unspecified abdominal pain: Secondary | ICD-10-CM | POA: Insufficient documentation

## 2011-09-12 HISTORY — DX: Diverticulosis of intestine, part unspecified, without perforation or abscess without bleeding: K57.90

## 2011-09-12 LAB — DIFFERENTIAL
Basophils Absolute: 0 10*3/uL (ref 0.0–0.1)
Basophils Relative: 0 % (ref 0–1)
Eosinophils Absolute: 0 10*3/uL (ref 0.0–0.7)
Monocytes Relative: 11 % (ref 3–12)
Neutro Abs: 1.9 10*3/uL (ref 1.7–7.7)
Neutrophils Relative %: 44 % (ref 43–77)

## 2011-09-12 LAB — CBC
Hemoglobin: 13.7 g/dL (ref 13.0–17.0)
MCHC: 34.8 g/dL (ref 30.0–36.0)
Platelets: 234 10*3/uL (ref 150–400)
RDW: 12.5 % (ref 11.5–15.5)
WBC: 4.3 10*3/uL (ref 4.0–10.5)

## 2011-09-12 MED ORDER — SODIUM CHLORIDE 0.9 % IV BOLUS (SEPSIS): 1000.0000 mL | Freq: Once | INTRAVENOUS | Status: DC

## 2011-09-12 MED ORDER — METOCLOPRAMIDE HCL 5 MG/ML IJ SOLN: 10.0000 mg | Freq: Once | INTRAMUSCULAR | Status: DC

## 2011-09-12 NOTE — ED Notes (Signed)
Pt states he is stationed in [place]., and has a gastric doctor there. Was told before "that i have stomach ulcers and diverticulosis, and usually they put me on cipro and flagyl and it'll die down, but it's flared up again"

## 2011-09-12 NOTE — ED Notes (Signed)
And pain,n/v started 4am-bloody stools since 1pm

## 2011-09-13 ENCOUNTER — Emergency Department: Payer: Self-pay | Admitting: Emergency Medicine

## 2011-09-13 LAB — LIPASE, BLOOD: Lipase: 44 U/L (ref 11–59)

## 2011-09-13 LAB — COMPREHENSIVE METABOLIC PANEL
ALT: 44 U/L (ref 0–53)
Calcium: 9.6 mg/dL (ref 8.4–10.5)
GFR calc Af Amer: >90 mL/min (ref >90)
Glucose, Bld: 95 mg/dL (ref 70–99)
Sodium: 139 mEq/L (ref 135–145)
Total Protein: 7.7 g/dL (ref 6.0–8.3)

## 2011-09-13 NOTE — ED Provider Notes (Signed)
History     CSN: [id number] Arrival date & time: 09/12/2011 10:42 PM   First MD Initiated Contact with Patient 09/12/11 2344      Chief Complaint  Patient presents with  . Abdominal Pain    (Consider location/radiation/quality/duration/timing/severity/associated sxs/prior treatment) HPI The patient presented with abdominal pain nausea and vomiting that started today. Patient has history of recurrent abdominal pain with monthly visits to the emergency room. Patient states he is in the [employer] and [address] [facility] [address] in [place]. Patient states he scheduled to have a colonoscopy. He has had his appendix removed and is gallbladder removed. He does not have any history of colitis or Crohn's disease. The patient denies any fevers. He did notice small amounts of blood in stool but has not been having any lightheadedness.  The pain increases with palpation and eating. It does not radiate anywhere. Symptoms have been constant. Past Medical History  Diagnosis Date  . Diverticulosis     Past Surgical History  Procedure Date  . Appendectomy   . Cholecystectomy     No family history on file.  History  Substance Use Topics  . Smoking status: Never Smoker   . Smokeless tobacco: Not on file  . Alcohol Use: No      Review of Systems  All other systems reviewed and are negative.    Allergies  Shrimp; Demerol; Toradol; Zofran; and Morphine and related  Home Medications   Current Outpatient Rx  Name Route Sig Dispense Refill  . ACETAMINOPHEN 500 MG PO TABS Oral Take 500 mg by mouth once.        BP 132/63  Pulse 70  Temp(Src) 98.7 F (37.1 C) (Oral)  Resp 18  Ht 6\' 3"  (1.905 m)  Wt 180 lb (81.647 kg)  BMI 22.50 kg/m2  SpO2 99%  Physical Exam  Nursing note and vitals reviewed. Constitutional: He appears well-developed and well-nourished. No distress.  HENT:  Head: Normocephalic and atraumatic.  Right Ear: External ear normal.  Left Ear: External ear normal.  Eyes:  Conjunctivae are normal. Right eye exhibits no discharge. Left eye exhibits no discharge. No scleral icterus.  Neck: Neck supple. No tracheal deviation present.  Cardiovascular: Normal rate, regular rhythm and intact distal pulses.   Pulmonary/Chest: Effort normal and breath sounds normal. No stridor. No respiratory distress. He has no wheezes. He has no rales.  Abdominal: Soft. Bowel sounds are normal. He exhibits no distension. There is tenderness in the right upper quadrant and right lower quadrant. There is no rebound and no guarding.  Musculoskeletal: He exhibits no edema and no tenderness.  Neurological: He is alert. He has normal strength. No sensory deficit. Cranial nerve deficit:  no gross defecits noted. He exhibits normal muscle tone. He displays no seizure activity. Coordination normal.  Skin: Skin is warm and dry. No rash noted.  Psychiatric: He has a normal mood and affect.    ED Course  Procedures (including critical care time)   Labs Reviewed  CBC  DIFFERENTIAL  BASIC METABOLIC PANEL  CBC  COMPREHENSIVE METABOLIC PANEL  LIPASE, BLOOD  URINALYSIS, ROUTINE W REFLEX MICROSCOPIC   No results found.   No diagnosis found.    MDM  After my exam, the patient informed the nursing staff that his father is apparently ill. Patient needed to leave AMA        Celene Kras, MD 09/13/11 [phone]

## 2011-09-13 NOTE — ED Notes (Signed)
Pt states he had a family emergency and has to leave. States he understands the risks of leaving and will return for any worsening symptoms. Pt is alert and oriented. Ambulatory to car.

## 2011-11-12 ENCOUNTER — Emergency Department (HOSPITAL_COMMUNITY)
Admission: EM | Admit: 2011-11-12 | Discharge: 2011-11-12 | Discharge status: Against Medical Advice | Payer: Self-pay | Attending: Emergency Medicine | Admitting: Emergency Medicine

## 2011-11-12 ENCOUNTER — Emergency Department: Payer: Self-pay | Admitting: Emergency Medicine

## 2011-11-12 ENCOUNTER — Encounter (HOSPITAL_COMMUNITY): Payer: Self-pay | Admitting: Emergency Medicine

## 2011-11-12 DIAGNOSIS — R1110 Vomiting, unspecified: Secondary | ICD-10-CM | POA: Insufficient documentation

## 2011-11-12 DIAGNOSIS — R1032 Left lower quadrant pain: Secondary | ICD-10-CM | POA: Insufficient documentation

## 2011-11-12 MED ORDER — METOCLOPRAMIDE HCL 5 MG/ML IJ SOLN
10.0000 mg | Freq: Once | INTRAMUSCULAR | Status: DC
  Filled 2011-11-12: qty 2

## 2011-11-12 MED ORDER — DIPHENHYDRAMINE HCL 50 MG/ML IJ SOLN
12.5000 mg | Freq: Once | INTRAMUSCULAR | Status: DC
  Filled 2011-11-12: qty 1

## 2011-11-12 MED ORDER — SODIUM CHLORIDE 0.9 % IV BOLUS (SEPSIS): 1000.0000 mL | Freq: Once | INTRAVENOUS | Status: DC

## 2011-11-12 NOTE — ED Provider Notes (Signed)
History     CSN: [id number]  Arrival date & time 11/12/11  7829   First MD Initiated Contact with Patient 11/12/11 0701      Chief Complaint  Patient presents with  . Abdominal Pain    (Consider location/radiation/quality/duration/timing/severity/associated sxs/prior treatment) Patient is a 25 y.o. male presenting with abdominal pain.  Abdominal Pain The primary symptoms of the illness include abdominal pain and vomiting. The primary symptoms of the illness do not include fever, diarrhea or dysuria. The current episode started 3 to 5 hours ago. The onset of the illness was sudden. The problem has not changed since onset. The abdominal pain radiates to the LLQ. The abdominal pain is relieved by nothing.  Symptoms associated with the illness do not include chills, urgency or back pain.  Pt has history of recurrent abdominal pain with almost monthly visits to the ED.    Past Medical History  Diagnosis Date  . Diverticulosis     Past Surgical History  Procedure Date  . Appendectomy   . Cholecystectomy     History reviewed. No pertinent family history.  History  Substance Use Topics  . Smoking status: Never Smoker   . Smokeless tobacco: Not on file  . Alcohol Use: No      Review of Systems  Constitutional: Negative for fever and chills.  Gastrointestinal: Positive for vomiting and abdominal pain. Negative for diarrhea.  Genitourinary: Negative for dysuria and urgency.  Musculoskeletal: Negative for back pain.  All other systems reviewed and are negative.    Allergies  Shrimp; Demerol; Toradol; Zofran; and Morphine and related  Home Medications   Current Outpatient Rx  Name Route Sig Dispense Refill  . ACETAMINOPHEN 500 MG PO TABS Oral Take 500 mg by mouth once.        BP 153/84  Pulse 63  Temp(Src) 98.5 F (36.9 C) (Oral)  Resp 14  Ht 6\' 3"  (1.905 m)  Wt 185 lb (83.915 kg)  BMI 23.12 kg/m2  SpO2 100%  Physical Exam  Nursing note and vitals  reviewed. Constitutional: He appears well-developed and well-nourished. No distress.  HENT:  Head: Normocephalic and atraumatic.  Right Ear: External ear normal.  Left Ear: External ear normal.  Eyes: Conjunctivae are normal. Right eye exhibits no discharge. Left eye exhibits no discharge. No scleral icterus.  Neck: Neck supple. No tracheal deviation present.  Cardiovascular: Normal rate, regular rhythm and intact distal pulses.   Pulmonary/Chest: Effort normal and breath sounds normal. No stridor. No respiratory distress. He has no wheezes. He has no rales.  Abdominal: Soft. Bowel sounds are normal. He exhibits no distension. There is tenderness (mild diffusely, no focal tenderness). There is no rebound and no guarding.  Musculoskeletal: He exhibits no edema and no tenderness.  Neurological: He is alert. He has normal strength. No sensory deficit. Cranial nerve deficit:  no gross defecits noted. He exhibits normal muscle tone. He displays no seizure activity. Coordination normal.  Skin: Skin is warm and dry. No rash noted.  Psychiatric: He has a normal mood and affect.    ED Course  Procedures (including critical care time)  Labs Reviewed - No data to display No results found.    MDM  Pt eloped shortly after my evaluation.  I have reviewed his old records.  He has had recurrent episodes of abdominal pain with almost monthly visits to the ED.        Celene Kras, MD 11/12/11 [phone]

## 2011-11-12 NOTE — ED Notes (Signed)
Pt stated he woke up around 0300 from abdominal pain and vomiting.

## 2011-11-12 NOTE — ED Notes (Signed)
Pt unable to locate x3. Provider notified.

## 2011-11-12 NOTE — ED Notes (Signed)
Unable to locate pt., spoke with Dr. Lynelle Doctor who seen the pt. Last at 07:05, will continue to monitor room

## 2011-12-25 ENCOUNTER — Emergency Department: Payer: Self-pay | Admitting: Emergency Medicine

## 2011-12-25 LAB — CBC WITH DIFFERENTIAL/PLATELET
Basophil %: 0.3 %
Eosinophil #: 0 10*3/uL (ref 0.0–0.7)
Lymphocyte %: 41.6 %
MCV: 86 fL (ref 80–100)
Neutrophil #: 1.8 10*3/uL (ref 1.4–6.5)
Neutrophil %: 47.4 %
Platelet: 229 10*3/uL (ref 150–440)
RBC: 5.01 10*6/uL (ref 4.40–5.90)
RDW: 13.1 % (ref 11.5–14.5)
WBC: 3.7 10*3/uL — ABNORMAL LOW (ref 3.8–10.6)

## 2011-12-25 LAB — COMPREHENSIVE METABOLIC PANEL
Alkaline Phosphatase: 91 U/L (ref 50–136)
Anion Gap: 9 (ref 7–16)
Calcium, Total: 9.4 mg/dL (ref 8.5–10.1)
Co2: 29 mmol/L (ref 21–32)
EGFR (Non-African Amer.): >60
Osmolality: 281 (ref 275–301)
SGOT(AST): 30 U/L (ref 15–37)
Sodium: 141 mmol/L (ref 136–145)

## 2011-12-25 LAB — LIPASE, BLOOD: Lipase: 184 U/L (ref 73–393)

## 2011-12-25 LAB — URINALYSIS, COMPLETE
Bacteria: NONE SEEN
Blood: NEGATIVE
Glucose,UR: NEGATIVE mg/dL (ref 0–75)
Leukocyte Esterase: NEGATIVE
Nitrite: NEGATIVE
RBC,UR: <1 /HPF (ref 0–5)

## 2012-01-16 ENCOUNTER — Emergency Department (HOSPITAL_COMMUNITY): Payer: Self-pay

## 2012-01-16 ENCOUNTER — Emergency Department (HOSPITAL_COMMUNITY)
Admission: EM | Admit: 2012-01-16 | Discharge: 2012-01-16 | Disposition: A | Discharge status: Home / Self Care | Payer: Self-pay | Attending: Emergency Medicine | Admitting: Emergency Medicine

## 2012-01-16 ENCOUNTER — Encounter (HOSPITAL_COMMUNITY): Payer: Self-pay | Admitting: Emergency Medicine

## 2012-01-16 DIAGNOSIS — R109 Unspecified abdominal pain: Secondary | ICD-10-CM

## 2012-01-16 DIAGNOSIS — R1110 Vomiting, unspecified: Secondary | ICD-10-CM | POA: Insufficient documentation

## 2012-01-16 DIAGNOSIS — R630 Anorexia: Secondary | ICD-10-CM | POA: Insufficient documentation

## 2012-01-16 DIAGNOSIS — Z9889 Other specified postprocedural states: Secondary | ICD-10-CM | POA: Insufficient documentation

## 2012-01-16 DIAGNOSIS — R6883 Chills (without fever): Secondary | ICD-10-CM | POA: Insufficient documentation

## 2012-01-16 DIAGNOSIS — K5900 Constipation, unspecified: Secondary | ICD-10-CM | POA: Insufficient documentation

## 2012-01-16 DIAGNOSIS — R1032 Left lower quadrant pain: Secondary | ICD-10-CM | POA: Insufficient documentation

## 2012-01-16 LAB — COMPREHENSIVE METABOLIC PANEL
ALT: 28 U/L (ref 0–53)
AST: 19 U/L (ref 0–37)
CO2: 29 mEq/L (ref 19–32)
Chloride: 105 mEq/L (ref 96–112)
GFR calc non Af Amer: >90 mL/min (ref >90)
Sodium: 137 mEq/L (ref 135–145)
Total Bilirubin: 0.3 mg/dL (ref 0.3–1.2)

## 2012-01-16 LAB — URINALYSIS, ROUTINE W REFLEX MICROSCOPIC
Glucose, UA: NEGATIVE mg/dL
Hgb urine dipstick: NEGATIVE
Specific Gravity, Urine: 1.02 (ref 1.005–1.030)
pH: 7.5 (ref 5.0–8.0)

## 2012-01-16 LAB — CBC
Hemoglobin: 13.4 g/dL (ref 13.0–17.0)
MCH: 28.8 pg (ref 26.0–34.0)
RBC: 4.65 MIL/uL (ref 4.22–5.81)

## 2012-01-16 LAB — DIFFERENTIAL
Lymphs Abs: 1.3 10*3/uL (ref 0.7–4.0)
Monocytes Relative: 10 % (ref 3–12)
Neutro Abs: 1.6 10*3/uL — ABNORMAL LOW (ref 1.7–7.7)
Neutrophils Relative %: 49 % (ref 43–77)

## 2012-01-16 LAB — LIPASE, BLOOD: Lipase: 58 U/L (ref 11–59)

## 2012-01-16 MED ORDER — SODIUM CHLORIDE 0.9 % IV SOLN
1000.0000 mL | INTRAVENOUS | Status: DC
  Administered 2012-01-16: 1000 mL via INTRAVENOUS

## 2012-01-16 MED ORDER — HYDROMORPHONE HCL PF 1 MG/ML IJ SOLN
1.0000 mg | Freq: Once | INTRAMUSCULAR | Status: AC
  Administered 2012-01-16: 1 mg via INTRAVENOUS
  Filled 2012-01-16: qty 1

## 2012-01-16 MED ORDER — SODIUM CHLORIDE 0.9 % IV SOLN: 1000.0000 mL | Freq: Once | INTRAVENOUS | Status: DC

## 2012-01-16 MED ORDER — ONDANSETRON HCL 4 MG/2ML IJ SOLN
4.0000 mg | Freq: Once | INTRAMUSCULAR | Status: DC
  Filled 2012-01-16: qty 2

## 2012-01-16 NOTE — Discharge Instructions (Signed)
Take MiraLAX twice a day for one week then once a day for 2 weeks. Use an enema this morning, and this afternoon to help you start having bowel movements. See your doctor if needed for problems.  Abdominal Pain Abdominal pain can be caused by many things. Your caregiver decides the seriousness of your pain by an examination and possibly blood tests and X-rays. Many cases can be observed and treated at home. Most abdominal pain is not caused by a disease and will probably improve without treatment. However, in many cases, more time must pass before a clear cause of the pain can be found. Before that point, it may not be known if you need more testing, or if hospitalization or surgery is needed. HOME CARE INSTRUCTIONS   Do not take laxatives unless directed by your caregiver.   Take pain medicine only as directed by your caregiver.   Only take over-the-counter or prescription medicines for pain, discomfort, or fever as directed by your caregiver.   Try a clear liquid diet (broth, tea, or water) for as long as directed by your caregiver. Slowly move to a bland diet as tolerated.  SEEK IMMEDIATE MEDICAL CARE IF:   The pain does not go away.   You have a fever.   You keep throwing up (vomiting).   The pain is felt only in portions of the abdomen. Pain in the right side could possibly be appendicitis. In an adult, pain in the left lower portion of the abdomen could be colitis or diverticulitis.   You pass bloody or black tarry stools.  MAKE SURE YOU:   Understand these instructions.   Will watch your condition.   Will get help right away if you are not doing well or get worse.  Document Released: 08/15/2005 Document Revised: 07/18/2011 Document Reviewed: 06/23/2008 [facility] Patient Information 2012 [place], [place].Constipation in Adults Constipation is having fewer than 2 bowel movements per week. Usually, the stools are hard. As we grow older, constipation is more common. If you try to  fix constipation with laxatives, the problem may get worse. This is because laxatives taken over a long period of time make the colon muscles weaker. A low-fiber diet, not taking in enough fluids, and taking some medicines may make these problems worse. MEDICATIONS THAT MAY CAUSE CONSTIPATION  Water pills (diuretics).   Calcium channel blockers (used to control blood pressure and for the heart).   Certain pain medicines (narcotics).   Anticholinergics.   Anti-inflammatory agents.   Antacids that contain aluminum.  DISEASES THAT CONTRIBUTE TO CONSTIPATION  Diabetes.   Parkinson's disease.   Dementia.   Stroke.   Depression.   Illnesses that cause problems with salt and water metabolism.  HOME CARE INSTRUCTIONS   Constipation is usually best cared for without medicines. Increasing dietary fiber and eating more fruits and vegetables is the best way to manage constipation.   Slowly increase fiber intake to 25 to 38 grams per day. Whole grains, fruits, vegetables, and legumes are good sources of fiber. A dietitian can further help you incorporate high-fiber foods into your diet.   Drink enough water and fluids to keep your urine clear or pale yellow.   A fiber supplement may be added to your diet if you cannot get enough fiber from foods.   Increasing your activities also helps improve regularity.   Suppositories, as suggested by your caregiver, will also help. If you are using antacids, such as aluminum or calcium containing products, it will be helpful  to switch to products containing magnesium if your caregiver says it is okay.   If you have been given a liquid injection (enema) today, this is only a temporary measure. It should not be relied on for treatment of longstanding (chronic) constipation.   Stronger measures, such as magnesium sulfate, should be avoided if possible. This may cause uncontrollable diarrhea. Using magnesium sulfate may not allow you time to make it to  the bathroom.  SEEK IMMEDIATE MEDICAL CARE IF:   There is bright red blood in the stool.   The constipation stays for more than 4 days.   There is belly (abdominal) or rectal pain.   You do not seem to be getting better.   You have any questions or concerns.  MAKE SURE YOU:   Understand these instructions.   Will watch your condition.   Will get help right away if you are not doing well or get worse.  Document Released: 08/03/2004 Document Revised: 07/18/2011 Document Reviewed: 06/23/2008 [facility] Patient Information 2012 [place], [place].

## 2012-01-16 NOTE — ED Provider Notes (Signed)
History     CSN: [id number]  Arrival date & time 01/16/12  9147   First MD Initiated Contact with Patient 01/16/12 0720      Chief Complaint  Patient presents with  . Abdominal Pain    LLQ  . Emesis    (Consider location/radiation/quality/duration/timing/severity/associated sxs/prior treatment) Patient is a 26 y.o. male presenting with abdominal pain and vomiting.  Abdominal Pain The primary symptoms of the illness include abdominal pain and vomiting. The current episode started yesterday. The onset of the illness was gradual. The problem has been gradually worsening.  The patient has not had a change in bowel habit. Additional symptoms associated with the illness include chills and anorexia. Symptoms associated with the illness do not include diaphoresis, urgency, hematuria or back pain. Associated medical issues comments: Diverticulitis and kidney stones.  Emesis  Associated symptoms include abdominal pain and chills.   Charles Potts does not have a local physician. Charles Potts has been diagnosed to treat for diverticulitis several times in the last few years. Charles Potts has had 4 CAT scans in the last 2 years. Charles Potts cannot recall the last time. Charles Potts passed kidney stone.  Charles Potts is not been able to eat or drink because it makes the pain worse. Charles Potts has not tried any medications for the problem.  Past Medical History  Diagnosis Date  . Diverticulosis     Past Surgical History  Procedure Date  . Appendectomy   . Cholecystectomy     No family history on file.  History  Substance Use Topics  . Smoking status: Never Smoker   . Smokeless tobacco: Not on file  . Alcohol Use: No      Review of Systems  Constitutional: Positive for chills. Negative for diaphoresis.  Gastrointestinal: Positive for vomiting, abdominal pain and anorexia.  Genitourinary: Negative for urgency and hematuria.  Musculoskeletal: Negative for back pain.  All other systems reviewed and are negative.    Allergies  Shrimp; Demerol;  Toradol; Zofran; and Morphine and related  Home Medications  No current outpatient prescriptions on file.  BP 158/84  Pulse 92  Temp(Src) 98.9 F (37.2 C) (Oral)  Resp 20  Wt 180 lb (81.647 kg)  SpO2 100%  Physical Exam  Nursing note and vitals reviewed. Constitutional: Charles Potts is oriented to person, place, and time. Charles Potts appears well-developed and well-nourished.  HENT:  Head: Normocephalic and atraumatic.  Right Ear: External ear normal.  Left Ear: External ear normal.  Eyes: Conjunctivae and EOM are normal. Pupils are equal, round, and reactive to light.  Neck: Normal range of motion and phonation normal. Neck supple.  Cardiovascular: Normal rate, regular rhythm, normal heart sounds and intact distal pulses.   Pulmonary/Chest: Effort normal and breath sounds normal. Charles Potts exhibits no bony tenderness.  Abdominal: Soft. Normal appearance and bowel sounds are normal. Charles Potts exhibits no mass. There is no tenderness (Left upper, lower quadrant tenderness, mild). There is no rebound and no guarding.  Genitourinary:       No costovertebral angle tenderness  Musculoskeletal: Normal range of motion.  Neurological: Charles Potts is alert and oriented to person, place, and time. Charles Potts has normal strength. No cranial nerve deficit or sensory deficit. Charles Potts exhibits normal muscle tone. Coordination normal.  Skin: Skin is warm, dry and intact.  Psychiatric: Charles Potts has a normal mood and affect. His behavior is normal. Judgment and thought content normal.    ED Course  Procedures (including critical care time) Emergency department treatment: IV fluids, and Dilaudid.    Labs Reviewed  CBC - Abnormal; Notable for the following:    WBC 3.3 (*)    HCT 38.1 (*)    All other components within normal limits  DIFFERENTIAL - Abnormal; Notable for the following:    Neutro Abs 1.6 (*)    All other components within normal limits  URINALYSIS, ROUTINE W REFLEX MICROSCOPIC  LIPASE, BLOOD  COMPREHENSIVE METABOLIC PANEL   Dg Abd  Acute W/chest  01/16/2012  *RADIOLOGY REPORT*  Clinical Data: Left sided abdominal pain with nausea and vomiting.  ACUTE ABDOMEN SERIES (ABDOMEN 2 VIEW & CHEST 1 VIEW)  Comparison: 11/26/2011.  Findings: Frontal view of the chest shows midline trachea and normal heart size.  Lungs are clear.  No pleural fluid.  Two views of the abdomen show retained contrast in colonic diverticula and the rectosigmoid colon.  Stool is seen in the majority of the colon.  Mild small bowel gaseous prominence. Surgical clips in the right upper quadrant.  IMPRESSION: Constipation.  Mild gaseous prominence of the small bowel is likely secondary.  Original Report Authenticated By: Reyes Ivan, M.D.   10:18 AM Reevaluation with update and discussion. After initial assessment  and treatment, an updated evaluation reveals the patient is comfortable.   [name]   1. Abdominal pain, acute   2. Constipation       MDM  Nonspecific abdominal pain, without evidence for urolithiasis, colitis, diverticulitis. Charles Potts states the last bowel movement was yesterday. Charles Potts is stable for discharge.   Plan: Home Medications-  OTC Miralax and Fleets enema; Home Treatments- warm baths; Recommended follow up- PCP prn     Flint Melter, MD 01/16/12 1026

## 2012-01-16 NOTE — ED Notes (Signed)
Pt alert, c/o llq pain, onset yesterday afternoon, denies changes in bowel or bladder, describes as sharp non radiating

## 2012-02-13 ENCOUNTER — Emergency Department (HOSPITAL_COMMUNITY): Payer: Self-pay

## 2012-02-13 ENCOUNTER — Emergency Department (HOSPITAL_COMMUNITY)
Admission: EM | Admit: 2012-02-13 | Discharge: 2012-02-13 | Disposition: A | Discharge status: Home / Self Care | Payer: Self-pay | Attending: Emergency Medicine | Admitting: Emergency Medicine

## 2012-02-13 ENCOUNTER — Emergency Department: Payer: Self-pay | Admitting: Emergency Medicine

## 2012-02-13 ENCOUNTER — Encounter (HOSPITAL_COMMUNITY): Payer: Self-pay

## 2012-02-13 DIAGNOSIS — M549 Dorsalgia, unspecified: Secondary | ICD-10-CM | POA: Insufficient documentation

## 2012-02-13 DIAGNOSIS — K5732 Diverticulitis of large intestine without perforation or abscess without bleeding: Secondary | ICD-10-CM

## 2012-02-13 DIAGNOSIS — Z9889 Other specified postprocedural states: Secondary | ICD-10-CM | POA: Insufficient documentation

## 2012-02-13 DIAGNOSIS — R110 Nausea: Secondary | ICD-10-CM | POA: Insufficient documentation

## 2012-02-13 DIAGNOSIS — R1031 Right lower quadrant pain: Secondary | ICD-10-CM | POA: Insufficient documentation

## 2012-02-13 LAB — CBC
HCT: 41 % (ref 39.0–52.0)
Hemoglobin: 14 g/dL (ref 13.0–17.0)
MCHC: 34.1 g/dL (ref 30.0–36.0)
MCV: 83.5 fL (ref 78.0–100.0)
RDW: 13.5 % (ref 11.5–15.5)
WBC: 3.4 10*3/uL — ABNORMAL LOW (ref 4.0–10.5)

## 2012-02-13 LAB — DIFFERENTIAL
Basophils Absolute: 0 10*3/uL (ref 0.0–0.1)
Eosinophils Relative: 1 % (ref 0–5)
Lymphocytes Relative: 36 % (ref 12–46)
Monocytes Absolute: 0.3 10*3/uL (ref 0.1–1.0)
Monocytes Relative: 10 % (ref 3–12)

## 2012-02-13 LAB — HEPATIC FUNCTION PANEL
ALT: 36 U/L (ref 0–53)
Alkaline Phosphatase: 105 U/L (ref 39–117)
Bilirubin, Direct: 0.1 mg/dL (ref 0.0–0.3)
Indirect Bilirubin: 0.6 mg/dL (ref 0.3–0.9)
Total Bilirubin: 0.7 mg/dL (ref 0.3–1.2)
Total Protein: 8 g/dL (ref 6.0–8.3)

## 2012-02-13 LAB — BASIC METABOLIC PANEL
BUN: 13 mg/dL (ref 6–23)
CO2: 27 mEq/L (ref 19–32)
Calcium: 10.1 mg/dL (ref 8.4–10.5)
Creatinine, Ser: 1.02 mg/dL (ref 0.50–1.35)

## 2012-02-13 LAB — LIPASE, BLOOD: Lipase: 43 U/L (ref 11–59)

## 2012-02-13 LAB — URINALYSIS, ROUTINE W REFLEX MICROSCOPIC
Bilirubin Urine: NEGATIVE
Glucose, UA: NEGATIVE mg/dL
Hgb urine dipstick: NEGATIVE
Protein, ur: NEGATIVE mg/dL
Specific Gravity, Urine: 1.02 (ref 1.005–1.030)
Urobilinogen, UA: 0.2 mg/dL (ref 0.0–1.0)

## 2012-02-13 MED ORDER — IOHEXOL 300 MG/ML  SOLN
100.0000 mL | Freq: Once | INTRAMUSCULAR | Status: AC | PRN
  Administered 2012-02-13: 100 mL via INTRAVENOUS

## 2012-02-13 MED ORDER — PROMETHAZINE HCL 25 MG/ML IJ SOLN
12.5000 mg | Freq: Once | INTRAMUSCULAR | Status: AC
  Administered 2012-02-13: 12.5 mg via INTRAVENOUS
  Filled 2012-02-13: qty 1

## 2012-02-13 MED ORDER — DIPHENHYDRAMINE HCL 50 MG/ML IJ SOLN
12.5000 mg | Freq: Once | INTRAMUSCULAR | Status: AC
  Administered 2012-02-13: 12.5 mg via INTRAVENOUS
  Filled 2012-02-13: qty 1

## 2012-02-13 MED ORDER — HYDROMORPHONE HCL PF 1 MG/ML IJ SOLN
1.0000 mg | Freq: Once | INTRAMUSCULAR | Status: AC
  Administered 2012-02-13: 1 mg via INTRAVENOUS
  Filled 2012-02-13: qty 1

## 2012-02-13 MED ORDER — HYDROCODONE-ACETAMINOPHEN 5-325 MG PO TABS: ORAL_TABLET | ORAL | Status: DC

## 2012-02-13 MED ORDER — PROMETHAZINE HCL 25 MG PO TABS: 25.0000 mg | ORAL_TABLET | Freq: Four times a day (QID) | ORAL | Status: DC | PRN

## 2012-02-13 NOTE — Discharge Instructions (Signed)
Your blood work is within normal limits. Your CT scan is negative for obstruction or abscess. Please see your stomach specialist for additonal evaluation and management of your abdominal pain.Diverticulitis A diverticulum is a small pouch or sac on the colon. Diverticulosis is the presence of these diverticula on the colon. Diverticulitis is the irritation (inflammation) or infection of diverticula. CAUSES  The colon and its diverticula contain bacteria. If food particles block the tiny opening to a diverticulum, the bacteria inside can grow and cause an increase in pressure. This leads to infection and inflammation and is called diverticulitis. SYMPTOMS   Abdominal pain and tenderness. Usually, the pain is located on the left side of your abdomen. However, it could be located elsewhere.   Fever.   Bloating.   Feeling sick to your stomach (nausea).   Throwing up (vomiting).   Abnormal stools.  DIAGNOSIS  Your caregiver will take a history and perform a physical exam. Since many things can cause abdominal pain, other tests may be necessary. Tests may include:  Blood tests.   Urine tests.   X-ray of the abdomen.   CT scan of the abdomen.  Sometimes, surgery is needed to determine if diverticulitis or other conditions are causing your symptoms. TREATMENT  Most of the time, you can be treated without surgery. Treatment includes:  Resting the bowels by only having liquids for a few days. As you improve, you will need to eat a low-fiber diet.   Intravenous (IV) fluids if you are losing body fluids (dehydrated).   Antibiotic medicines that treat infections may be given.   Pain and nausea medicine, if needed.   Surgery if the inflamed diverticulum has burst.  HOME CARE INSTRUCTIONS   Try a clear liquid diet (broth, tea, or water for as long as directed by your caregiver). You may then gradually begin a low-fiber diet as tolerated. A low-fiber diet is a diet with less than 10 grams  of fiber. Choose the foods below to reduce fiber in the diet:   White breads, cereals, rice, and pasta.   Cooked fruits and vegetables or soft fresh fruits and vegetables without the skin.   Ground or well-cooked tender beef, ham, veal, lamb, pork, or poultry.   Eggs and seafood.   After your diverticulitis symptoms have improved, your caregiver may put you on a high-fiber diet. A high-fiber diet includes 14 grams of fiber for every 1000 calories consumed. For a standard 2000 calorie diet, you would need 28 grams of fiber. Follow these diet guidelines to help you increase the fiber in your diet. It is important to slowly increase the amount fiber in your diet to avoid gas, constipation, and bloating.   Choose whole-grain breads, cereals, pasta, and brown rice.   Choose fresh fruits and vegetables with the skin on. Do not overcook vegetables because the more vegetables are cooked, the more fiber is lost.   Choose more nuts, seeds, legumes, dried peas, beans, and lentils.   Look for food products that have greater than 3 grams of fiber per serving on the Nutrition Facts label.   Take all medicine as directed by your caregiver.   If your caregiver has given you a follow-up appointment, it is very important that you go. Not going could result in lasting (chronic) or permanent injury, pain, and disability. If there is any problem keeping the appointment, call to reschedule.  SEEK MEDICAL CARE IF:   Your pain does not improve.   You have a hard  time advancing your diet beyond clear liquids.   Your bowel movements do not return to normal.  SEEK IMMEDIATE MEDICAL CARE IF:   Your pain becomes worse.   You have an oral temperature above 102 F (38.9 C), not controlled by medicine.   You have repeated vomiting.   You have bloody or black, tarry stools.   Symptoms that brought you to your caregiver become worse or are not getting better.  MAKE SURE YOU:   Understand these  instructions.   Will watch your condition.   Will get help right away if you are not doing well or get worse.  Document Released: 08/15/2005 Document Revised: 10/25/2011 Document Reviewed: 12/11/2010 [facility] Patient Information 2012 [place], [place].

## 2012-02-13 NOTE — ED Notes (Signed)
Pt c/o LLQ pain since yesterday afternoon.  Reports woke up around 0500 vomiting.  Denies diarrhea, LBM was yesterday am and was normal.  Denies difficulty urinating.  Denies history of kidney stone

## 2012-02-13 NOTE — ED Notes (Signed)
Patient back from x-ray-now c/o increase in nausea and pain. EDPA made aware-verbal orders given.

## 2012-02-18 ENCOUNTER — Encounter (HOSPITAL_COMMUNITY): Payer: Self-pay

## 2012-02-18 ENCOUNTER — Emergency Department (HOSPITAL_COMMUNITY)
Admission: EM | Admit: 2012-02-18 | Discharge: 2012-02-18 | Disposition: A | Discharge status: Home / Self Care | Payer: Self-pay | Attending: Emergency Medicine | Admitting: Emergency Medicine

## 2012-02-18 ENCOUNTER — Emergency Department (HOSPITAL_COMMUNITY): Payer: Self-pay

## 2012-02-18 DIAGNOSIS — R112 Nausea with vomiting, unspecified: Secondary | ICD-10-CM | POA: Insufficient documentation

## 2012-02-18 DIAGNOSIS — N50819 Testicular pain, unspecified: Secondary | ICD-10-CM

## 2012-02-18 DIAGNOSIS — N508 Other specified disorders of male genital organs: Secondary | ICD-10-CM | POA: Insufficient documentation

## 2012-02-18 DIAGNOSIS — R509 Fever, unspecified: Secondary | ICD-10-CM | POA: Insufficient documentation

## 2012-02-18 DIAGNOSIS — R10819 Abdominal tenderness, unspecified site: Secondary | ICD-10-CM | POA: Insufficient documentation

## 2012-02-18 DIAGNOSIS — N509 Disorder of male genital organs, unspecified: Secondary | ICD-10-CM | POA: Insufficient documentation

## 2012-02-18 DIAGNOSIS — N503 Cyst of epididymis: Secondary | ICD-10-CM

## 2012-02-18 DIAGNOSIS — Z9889 Other specified postprocedural states: Secondary | ICD-10-CM | POA: Insufficient documentation

## 2012-02-18 DIAGNOSIS — R319 Hematuria, unspecified: Secondary | ICD-10-CM | POA: Insufficient documentation

## 2012-02-18 LAB — CBC
MCH: 28 pg (ref 26.0–34.0)
MCHC: 33.2 g/dL (ref 30.0–36.0)
MCV: 84.4 fL (ref 78.0–100.0)
Platelets: 276 10*3/uL (ref 150–400)
RBC: 5.18 MIL/uL (ref 4.22–5.81)

## 2012-02-18 LAB — BASIC METABOLIC PANEL
BUN: 13 mg/dL (ref 6–23)
Calcium: 9.9 mg/dL (ref 8.4–10.5)
Creatinine, Ser: 0.94 mg/dL (ref 0.50–1.35)
GFR calc Af Amer: >90 mL/min (ref >90)
GFR calc non Af Amer: >90 mL/min (ref >90)
Glucose, Bld: 107 mg/dL — ABNORMAL HIGH (ref 70–99)
Potassium: 4.1 mEq/L (ref 3.5–5.1)

## 2012-02-18 LAB — DIFFERENTIAL
Basophils Relative: 0 % (ref 0–1)
Eosinophils Absolute: 0 10*3/uL (ref 0.0–0.7)
Eosinophils Relative: 0 % (ref 0–5)
Lymphs Abs: 1.4 10*3/uL (ref 0.7–4.0)
Monocytes Relative: 6 % (ref 3–12)
Neutrophils Relative %: 64 % (ref 43–77)

## 2012-02-18 LAB — URINALYSIS, ROUTINE W REFLEX MICROSCOPIC
Bilirubin Urine: NEGATIVE
Hgb urine dipstick: NEGATIVE
Ketones, ur: NEGATIVE mg/dL
Nitrite: NEGATIVE
Protein, ur: NEGATIVE mg/dL
Urobilinogen, UA: 0.2 mg/dL (ref 0.0–1.0)

## 2012-02-18 MED ORDER — SODIUM CHLORIDE 0.9 % IV SOLN
20.0000 mL | INTRAVENOUS | Status: DC
  Administered 2012-02-18: 20 mL via INTRAVENOUS

## 2012-02-18 MED ORDER — AZITHROMYCIN 250 MG PO TABS
1000.0000 mg | ORAL_TABLET | Freq: Once | ORAL | Status: AC
  Administered 2012-02-18: 1000 mg via ORAL
  Filled 2012-02-18: qty 4

## 2012-02-18 MED ORDER — HYDROMORPHONE HCL PF 1 MG/ML IJ SOLN
1.0000 mg | Freq: Once | INTRAMUSCULAR | Status: AC
  Administered 2012-02-18: 1 mg via INTRAVENOUS
  Filled 2012-02-18: qty 1

## 2012-02-18 MED ORDER — CEFTRIAXONE SODIUM 250 MG IJ SOLR
250.0000 mg | Freq: Once | INTRAMUSCULAR | Status: AC
Start: 2012-02-18 — End: 2012-02-18
  Administered 2012-02-18: 250 mg via INTRAMUSCULAR
  Filled 2012-02-18: qty 250

## 2012-02-18 NOTE — ED Notes (Signed)
Patient states he needs something for nausea and pain. RN Juliette Alcide aware.

## 2012-02-18 NOTE — ED Provider Notes (Signed)
History   This chart was scribed for Charles Quarry, MD by Brooks Sailors. The patient was seen in room APA03/APA03. Patient's care was started at 1011.   CSN: [id number]  Arrival date & time 02/18/12  1011   First MD Initiated Contact with Patient 02/18/12 1024      Chief Complaint  Patient presents with  . Testicle Pain  . Hematuria  . Abdominal Pain    (Consider location/radiation/quality/duration/timing/severity/associated sxs/prior treatment) HPI Sawyer Mentzer is a 26 y.o. male who presents to the Emergency Department complaining of constant severe left testicular pain onset yesterday and persistent since with associated hematuria, fever, nausea, and vomiting. Patient describes the pain as stabbing with 9/10 severity and sometimes radiating to the LLQ. Pain started yesterday morning and gradually increased throughout the day. Pain is aggravated by palpitations and ambulating. Denies urethral discharge, dysuria, and swelling. Surgical history of Appendectomy and cholecystecotomy.     Past Medical History  Diagnosis Date  . Diverticulosis     Past Surgical History  Procedure Date  . Appendectomy   . Cholecystectomy     No family history on file.  History  Substance Use Topics  . Smoking status: Never Smoker   . Smokeless tobacco: Not on file  . Alcohol Use: No      Review of Systems A complete 10 system review of systems was obtained and all systems are negative except as noted in the HPI and PMH.   Allergies  Shrimp; Demerol; Toradol; Zofran; and Morphine and related  Home Medications   Current Outpatient Rx  Name Route Sig Dispense Refill  . HYDROCODONE-ACETAMINOPHEN 5-325 MG PO TABS  1 or 2 po q4h prn pain 20 tablet 0  . PROMETHAZINE HCL 25 MG PO TABS Oral Take 1 tablet (25 mg total) by mouth every 6 (six) hours as needed for nausea. 12 tablet 0    BP 141/61  Pulse 100  Temp(Src) 98 F (36.7 C) (Oral)  Resp 17  SpO2 100%  Physical Exam  Nursing  note and vitals reviewed. Constitutional: He is oriented to person, place, and time. He appears well-developed and well-nourished. No distress.  HENT:  Head: Normocephalic and atraumatic.  Eyes: EOM are normal. Pupils are equal, round, and reactive to light.  Neck: Neck supple. No tracheal deviation present.  Cardiovascular: Normal rate, regular rhythm and normal heart sounds.   Pulmonary/Chest: Effort normal. No respiratory distress.  Abdominal: Soft. He exhibits no distension. There is tenderness.       Scars consistent with appendectomy and cholecystectomy  Genitourinary:       Tenderness in the left testicle.   Musculoskeletal: Normal range of motion. He exhibits no edema.  Neurological: He is alert and oriented to person, place, and time. No sensory deficit.  Skin: Skin is warm and dry.  Psychiatric: He has a normal mood and affect. His behavior is normal.    ED Course  Procedures (including critical care time) DIAGNOSTIC STUDIES: Oxygen Saturation is 100% on room air, normal by my interpretation.    COORDINATION OF CARE: 10:35AM Patient informed of current plan for treatment and evaluation and agrees with plan at this time.      Labs Reviewed  BASIC METABOLIC PANEL  URINALYSIS, ROUTINE W REFLEX MICROSCOPIC  CBC  DIFFERENTIAL   No results found.   No diagnosis found.    MDM   Patient was given Dilaudid as it is noted that he is allergic to Demerol and Toradol and Zofran. I  reviewed his previous multiple visits for abdominal pain. Ultrasound of testes show no evidence of torsion although there may be a very small cyst on the left epididymis.He is advised to followup with urology. Patient will be treated for STDs given the secure pain and age and sexual activity.     I personally performed the services described in this documentation, which was scribed in my presence. The recorded information has been reviewed and considered.    Charles Quarry, MD 02/18/12  [phone]

## 2012-02-18 NOTE — ED Notes (Signed)
Pt left er without instructions.

## 2012-02-18 NOTE — Discharge Instructions (Signed)
You have a very small cyst on the right epididymis. Please followup with the urologist. This should not be causing her pain. You have been treated for infections that may be causing the pain. Please use Tylenol cool compresses and elevation of the testicle as needed for pain control. If this pain continues please followup with the urologist.   Testicular Masses Most testicular masses, such as a growth or a swelling, are benign. This means they are not cancerous. Common types of testicular masses include:   Hydrocele is the most common benign testicular mass in an adult. Hydroceles are generally soft, painless scrotal swellings that are collections of fluid in the scrotal sac. These can rapidly change size as the fluid enters or leaves.   Spermatoceles are generally soft, painless, benign swellings that are cyst-like masses in the scrotum containing fluid. They can rapidly change size as the fluid enters or leaves. They are more prominent while standing or exercising. Sometimes, spermatoceles may cause a sensation of heaviness or a dull ache.   Varicocele is an enlargement of the veins that drain the testicles. This condition can increase the risk of infertility. They are more prominent while standing or exercising. Sometimes, varicoceles may cause a sensation of heaviness or a dull ache.   Inguinal hernia is a bulge caused by a portion of intestine protruding into the scrotum through a weak area in the abdominal muscles. Hernias may or may not be painful. They are soft and usually enlarge with coughing or straining.   Torsion of the testis can cause a testicular mass that develops quickly and is associated with tenderness and/or fever. This is caused by a twisting of the testicle within the sac. It also reduces the blood supply and can destroy the testis if not treated quickly with surgery.   Epididymitis is inflammation of the epididymis (a structure attached to the testicle), usually caused by a  sexually transmitted infection or a urinary tract infection. This generally shows up as testicular discomfort and swelling, and may include pain during urination.   Testicular appendages are remnants of tissue on the testis present since birth. A testicular appendage can twist on its blood supply and cause pain. In most cases, this is seen as a blue dot on the scrotum.  A cancerous growth in the scrotum may first appear as a swelling. There may or may not be pain. The growth usually feels firm and shows up as a growth on the testicle. Any solid, firm growth in a testicle is considered cancer until proven otherwise. Cancer of the testicle most commonly affects men 79 to 27 years old. Risk factors include prior testicular tumor and cryptorchidism (undescended testis). Occasionally, testicular cancer may appear with symptoms (problems) of metastasis. This means the tumor (abnormal growth) has spread and is causing other problems that may include cough, shortness of breath or weight loss. Monthly testicular self-exams are recommended for all men. Get in the habit of examining your own testicles. A good time is while taking a shower. Get to know what your testicles feel like so you will know if there is a new growth or change in them. DIAGNOSIS  See your caregiver if you feel a growth in your testicle. Sometimes, all that is needed to make the diagnosis (determine what is wrong) is a physical exam. Your caregiver may shine a bright light through the scrotum to help make the diagnosis. This is called transillumination. The light will shine easily through a collection of fluid but will  usually not shine through a tumor. Other testing, including blood tests and an ultrasound exam, may be done. An ultrasound exam bounces harmless sound waves off the testicles and produces a black and white picture almost like that produced by a camera. Diagnosis of testicular cancer can be made by measuring several substances in the  blood, called markers), that may indicate the presence of certain cancers. TREATMENT  What is wrong determines how it is treated. Small hydroceles and spermatoceles often require no treatment. In some cases, however, they may be treated surgically. Hernias are repaired with surgery. Because epididymitis is usually caused by an infection, it is usually treated with antibiotics. Varicoceles may be treated by surgery to tie off the affected veins. Testicular cancer treatment depends upon the type of cancer. Sometimes, some tissue is removed surgically as a way of trying to preserve the testicle but if a tumor is suspected, the preferred treatment is removal of the entire testicle. Further treatment may include watching the growth with strict follow-up, chemotherapy or radiation. If a growth has been found in a testicle, your caregiver will help you determine the best treatment. Document Released: 05/12/2003 Document Revised: 10/25/2011 Document Reviewed: 11/05/2005 [facility] Patient Information 2012 [place], [place].

## 2012-02-18 NOTE — ED Notes (Signed)
C/o testicle pain, left lower abd pain and hematuria that began this am. Nausea as well.

## 2012-02-18 NOTE — ED Notes (Signed)
Pt left er without discharge instructions.

## 2012-02-18 NOTE — ED Notes (Signed)
Dr. Rosalia Hammers notified of pt requesting additional pain and nausea medication, no additional orders given

## 2012-02-18 NOTE — ED Notes (Signed)
MD at bedside explaining discharge instructions, pt expressed understanding.

## 2012-02-19 LAB — GC/CHLAMYDIA PROBE AMP, GENITAL
Chlamydia, DNA Probe: NEGATIVE
GC Probe Amp, Genital: NEGATIVE

## 2012-02-27 NOTE — ED Provider Notes (Signed)
History     CSN: [id number]  Arrival date & time 02/13/12  0830   First MD Initiated Contact with Patient 02/13/12 [phone]      Chief Complaint  Patient presents with  . Abdominal Pain    (Consider location/radiation/quality/duration/timing/severity/associated sxs/prior treatment) Patient is a 26 y.o. male presenting with abdominal pain. The history is provided by the patient.  Abdominal Pain The primary symptoms of the illness include abdominal pain, nausea and vomiting. The primary symptoms of the illness do not include fever, shortness of breath, diarrhea, hematochezia or dysuria. The current episode started 3 to 5 hours ago. The onset of the illness was gradual. The problem has been gradually worsening.  Associated with: unknown. The patient has not had a change in bowel habit. Additional symptoms associated with the illness include anorexia and back pain. Symptoms associated with the illness do not include heartburn, urgency, hematuria or frequency. Associated medical issues comments: diverticulosis.    Past Medical History  Diagnosis Date  . Diverticulosis     Past Surgical History  Procedure Date  . Appendectomy   . Cholecystectomy     No family history on file.  History  Substance Use Topics  . Smoking status: Never Smoker   . Smokeless tobacco: Not on file  . Alcohol Use: No      Review of Systems  Constitutional: Negative for fever and activity change.       All ROS Neg except as noted in HPI  HENT: Negative for nosebleeds and neck pain.   Eyes: Negative for photophobia and discharge.  Respiratory: Negative for cough, shortness of breath and wheezing.   Cardiovascular: Negative for chest pain and palpitations.  Gastrointestinal: Positive for nausea, vomiting, abdominal pain and anorexia. Negative for heartburn, diarrhea, blood in stool and hematochezia.  Genitourinary: Negative for dysuria, urgency, frequency and hematuria.  Musculoskeletal: Positive for  back pain. Negative for arthralgias.  Skin: Negative.   Neurological: Negative for dizziness, seizures and speech difficulty.  Psychiatric/Behavioral: Negative for hallucinations and confusion.    Allergies  Shrimp; Demerol; Toradol; Zofran; and Morphine and related  Home Medications   Current Outpatient Rx  Name Route Sig Dispense Refill  . HYDROCODONE-ACETAMINOPHEN 5-325 MG PO TABS  1 or 2 po q4h prn pain 20 tablet 0    BP 135/63  Pulse 58  Temp 97.9 F (36.6 C)  Resp 16  Ht 6\' 3"  (1.905 m)  Wt 180 lb (81.647 kg)  BMI 22.50 kg/m2  SpO2 100%  Physical Exam  Nursing note and vitals reviewed. Constitutional: He is oriented to person, place, and time. He appears well-developed and well-nourished.  Non-toxic appearance.  HENT:  Head: Normocephalic.  Right Ear: Tympanic membrane and external ear normal.  Left Ear: Tympanic membrane and external ear normal.  Eyes: EOM and lids are normal. Pupils are equal, round, and reactive to light.  Neck: Normal range of motion. Neck supple. Carotid bruit is not present.  Cardiovascular: Normal rate, regular rhythm, normal heart sounds, intact distal pulses and normal pulses.   Pulmonary/Chest: Breath sounds normal. No respiratory distress.  Abdominal: Soft. Bowel sounds are normal. There is no tenderness. There is no guarding.       Left mid an lower quad pain to palpation. No mass. Not distended.  Genitourinary:       Stool neg for occult blood. No mass present  Musculoskeletal: Normal range of motion.  Lymphadenopathy:       Head (right side): No submandibular adenopathy present.  Head (left side): No submandibular adenopathy present.    He has no cervical adenopathy.  Neurological: He is alert and oriented to person, place, and time. He has normal strength. No cranial nerve deficit or sensory deficit.  Skin: Skin is warm and dry.  Psychiatric: He has a normal mood and affect. His speech is normal.    ED Course  Procedures  (including critical care time)  Labs Reviewed  CBC - Abnormal; Notable for the following:    WBC 3.4 (*)    All other components within normal limits  URINALYSIS, ROUTINE W REFLEX MICROSCOPIC  DIFFERENTIAL  BASIC METABOLIC PANEL  HEPATIC FUNCTION PANEL  LIPASE, BLOOD  OCCULT BLOOD, POC DEVICE  LAB REPORT - SCANNED   No results found.   1. Abdominal pain   2. Diverticulitis of colon       MDM  I have reviewed nursing notes, vital signs, and all appropriate lab and imaging results for this patient. CT reveals no obstruction . Diverticulosis, but no diverticulitis   Results for orders placed during the hospital encounter of 02/13/12  URINALYSIS, ROUTINE W REFLEX MICROSCOPIC      Component Value Range   Color, Urine YELLOW  YELLOW    APPearance CLEAR  CLEAR    Specific Gravity, Urine 1.020  1.005 - 1.030    pH 7.0  5.0 - 8.0    Glucose, UA NEGATIVE  NEGATIVE (mg/dL)   Hgb urine dipstick NEGATIVE  NEGATIVE    Bilirubin Urine NEGATIVE  NEGATIVE    Ketones, ur NEGATIVE  NEGATIVE (mg/dL)   Protein, ur NEGATIVE  NEGATIVE (mg/dL)   Urobilinogen, UA 0.2  0.0 - 1.0 (mg/dL)   Nitrite NEGATIVE  NEGATIVE    Leukocytes, UA NEGATIVE  NEGATIVE   CBC      Component Value Range   WBC 3.4 (*) 4.0 - 10.5 (K/uL)   RBC 4.91  4.22 - 5.81 (MIL/uL)   Hemoglobin 14.0  13.0 - 17.0 (g/dL)   HCT 36.6  44.0 - 34.7 (%)   MCV 83.5  78.0 - 100.0 (fL)   MCH 28.5  26.0 - 34.0 (pg)   MCHC 34.1  30.0 - 36.0 (g/dL)   RDW 42.5  95.6 - 38.7 (%)   Platelets 254  150 - 400 (K/uL)  DIFFERENTIAL      Component Value Range   Neutrophils Relative 53  43 - 77 (%)   Neutro Abs 1.8  1.7 - 7.7 (K/uL)   Lymphocytes Relative 36  12 - 46 (%)   Lymphs Abs 1.2  0.7 - 4.0 (K/uL)   Monocytes Relative 10  3 - 12 (%)   Monocytes Absolute 0.3  0.1 - 1.0 (K/uL)   Eosinophils Relative 1  0 - 5 (%)   Eosinophils Absolute 0.0  0.0 - 0.7 (K/uL)   Basophils Relative 0  0 - 1 (%)   Basophils Absolute 0.0  0.0 - 0.1  (K/uL)  BASIC METABOLIC PANEL      Component Value Range   Sodium 137  135 - 145 (mEq/L)   Potassium 4.0  3.5 - 5.1 (mEq/L)   Chloride 100  96 - 112 (mEq/L)   CO2 27  19 - 32 (mEq/L)   Glucose, Bld 98  70 - 99 (mg/dL)   BUN 13  6 - 23 (mg/dL)   Creatinine, Ser 5.64  0.50 - 1.35 (mg/dL)   Calcium 33.2  8.4 - 10.5 (mg/dL)   GFR calc non Af Amer >90  >90 (  mL/min)   GFR calc Af Amer >90  >90 (mL/min)  HEPATIC FUNCTION PANEL      Component Value Range   Total Protein 8.0  6.0 - 8.3 (g/dL)   Albumin 4.5  3.5 - 5.2 (g/dL)   AST 23  0 - 37 (U/L)   ALT 36  0 - 53 (U/L)   Alkaline Phosphatase 105  39 - 117 (U/L)   Total Bilirubin 0.7  0.3 - 1.2 (mg/dL)   Bilirubin, Direct 0.1  0.0 - 0.3 (mg/dL)   Indirect Bilirubin 0.6  0.3 - 0.9 (mg/dL)  LIPASE, BLOOD      Component Value Range   Lipase 43  11 - 59 (U/L)  OCCULT BLOOD, POC DEVICE      Component Value Range   Fecal Occult Bld POSITIVE     US Scrotum  02/18/2012  *RADIOLOGY REPORT*  Clinical Data:  Left testicular pain  SCROTAL ULTRASOUND DOPPLER ULTRASOUND OF THE TESTICLES  Technique: Complete ultrasound examination of the testicles, epididymis, and other scrotal structures was performed.  Color and spectral Doppler ultrasound were also utilized to evaluate blood flow to the testicles.  Comparison:  None  Findings:  Right testis:  4.2 x 1.4 x 2.3 cm.  Normal echogenicity.  No calcification, mass, or cyst.  Internal blood flow present on color Doppler imaging.  Left testis:  3.7 x 1.5 x 2.5 cm.  Normal echogenicity.  No calcifications, mass or cyst.  Internal blood flow present on color Doppler imaging.  Right epididymis:  Normal in size and appearance.  Left epididymis:  Normal size.  Small cyst left epididymis 2 x 4 x 4 mm.  Hydocele:  Small left hydrocele present.  No right hydrocele seen.  Varicocele:  Absent bilaterally  Pulsed Doppler interrogation of both testes demonstrates low resistance symmetric blood flow bilaterally.  No inguinal  hernia identified.  IMPRESSION: Tiny epididymal cyst versus spermatocele in left epididymis. Otherwise normal exam.  Original Report Authenticated By: Lollie Marrow, M.D.   Ct Abdomen Pelvis W Contrast  02/13/2012  *RADIOLOGY REPORT*  Clinical Data: Abdominal pain, abnormal radiographs with small bowel dilatation question obstruction  CT ABDOMEN AND PELVIS WITH CONTRAST  Technique:  Multidetector CT imaging of the abdomen and pelvis was performed following the standard protocol during bolus administration of intravenous contrast. Sagittal and coronal MPR images reconstructed from axial data set.  Contrast:  Dilute oral contrast. 100 ml Omnipaque 300 IV.  Comparison: 02/11/2012 Correlation:  Abdominal radiographs 02/13/2012  Findings: Lung bases clear. Gallbladder and appendix surgically absent by history. Liver, spleen, pancreas, kidneys, and adrenal glands normal. Scattered colonic diverticula with retained dense contrast. Small amount of nonspecific free pelvic fluid. Stomach normal appearance. Few air filled loops of small bowel in the mid abdomen are minimally prominent in size though no definite evidence of obstruction or transition in caliber is seen. No bowel wall thickening, mass, adenopathy, or hernia. Normal-appearing bladder and ureters. Bones unremarkable.  IMPRESSION: A few air filled loops of small bowel in the mid abdomen are minimally prominent in size though no definite evidence of bowel obstruction or transition zone is identified. Scattered colonic diverticulosis. Remainder of exam unchanged.  Original Report Authenticated By: Lollie Marrow, M.D.   [place] Art/ven Flow Abd Pelv Doppler  02/18/2012  *RADIOLOGY REPORT*  Clinical Data:  Left testicular pain  SCROTAL ULTRASOUND DOPPLER ULTRASOUND OF THE TESTICLES  Technique: Complete ultrasound examination of the testicles, epididymis, and other scrotal structures was performed.  Color and spectral  Doppler ultrasound were also utilized to evaluate  blood flow to the testicles.  Comparison:  None  Findings:  Right testis:  4.2 x 1.4 x 2.3 cm.  Normal echogenicity.  No calcification, mass, or cyst.  Internal blood flow present on color Doppler imaging.  Left testis:  3.7 x 1.5 x 2.5 cm.  Normal echogenicity.  No calcifications, mass or cyst.  Internal blood flow present on color Doppler imaging.  Right epididymis:  Normal in size and appearance.  Left epididymis:  Normal size.  Small cyst left epididymis 2 x 4 x 4 mm.  Hydocele:  Small left hydrocele present.  No right hydrocele seen.  Varicocele:  Absent bilaterally  Pulsed Doppler interrogation of both testes demonstrates low resistance symmetric blood flow bilaterally.  No inguinal hernia identified.  IMPRESSION: Tiny epididymal cyst versus spermatocele in left epididymis. Otherwise normal exam.  Original Report Authenticated By: Lollie Marrow, M.D.   Dg Abd Acute W/chest  02/13/2012  *RADIOLOGY REPORT*  Clinical Data: Left abdominal pain.  ACUTE ABDOMEN SERIES (ABDOMEN 2 VIEW & CHEST 1 VIEW)  Comparison: CT abdomen pelvis 02/11/2012 and acute abdominal series 01/23/2012.  Findings: Frontal view of the chest shows midline trachea and normal heart size.  Lungs are clear.  No pleural fluid.  Two views of the abdomen show dilated loops of small bowel with associated air fluid levels.  Stool is seen in the majority of the colon.  Retained contrast is seen in numerous colonic diverticula.  IMPRESSION:  Dilated small bowel with associated air fluid levels, indicative of obstruction. Alternatively, findings may be due to constipation.  Original Report Authenticated By: Reyes Ivan, M.D.       Kathie Dike, [place] 02/27/12 [phone]

## 2012-02-28 ENCOUNTER — Emergency Department: Payer: Self-pay | Admitting: Internal Medicine

## 2012-02-28 LAB — URINALYSIS, COMPLETE
Glucose,UR: NEGATIVE mg/dL (ref 0–75)
Ketone: NEGATIVE
Leukocyte Esterase: NEGATIVE
Ph: 7 (ref 4.5–8.0)
Protein: NEGATIVE
RBC,UR: <1 /HPF (ref 0–5)
Specific Gravity: 1.014 (ref 1.003–1.030)
Squamous Epithelial: <1
WBC UR: <1 /HPF (ref 0–5)

## 2012-02-28 LAB — BASIC METABOLIC PANEL
Anion Gap: 8 (ref 7–16)
BUN: 12 mg/dL (ref 7–18)
Calcium, Total: 9.3 mg/dL (ref 8.5–10.1)
Chloride: 104 mmol/L (ref 98–107)
Co2: 26 mmol/L (ref 21–32)
EGFR (African American): >60
EGFR (Non-African Amer.): >60
Osmolality: 275 (ref 275–301)
Sodium: 138 mmol/L (ref 136–145)

## 2012-02-28 LAB — CBC
HGB: 14.1 g/dL (ref 13.0–18.0)
MCHC: 32.8 g/dL (ref 32.0–36.0)
MCV: 87 fL (ref 80–100)
RBC: 4.92 10*6/uL (ref 4.40–5.90)
RDW: 14.3 % (ref 11.5–14.5)
WBC: 3.2 10*3/uL — ABNORMAL LOW (ref 3.8–10.6)

## 2012-02-28 LAB — LIPASE, BLOOD: Lipase: 187 U/L (ref 73–393)

## 2012-02-28 LAB — HEPATIC FUNCTION PANEL A (ARMC)
Albumin: 4.4 g/dL (ref 3.4–5.0)
Alkaline Phosphatase: 102 U/L (ref 50–136)
Bilirubin, Direct: <0.1 mg/dL (ref 0.00–0.20)
SGOT(AST): 73 U/L — ABNORMAL HIGH (ref 15–37)
Total Protein: 8.3 g/dL — ABNORMAL HIGH (ref 6.4–8.2)

## 2012-02-28 NOTE — ED Provider Notes (Signed)
Medical screening examination/treatment/procedure(s) were performed by non-physician practitioner and as supervising physician I was immediately available for consultation/collaboration.  Donnetta Hutching, MD 02/28/12 1740

## 2012-03-05 ENCOUNTER — Emergency Department (HOSPITAL_COMMUNITY)
Admission: EM | Admit: 2012-03-05 | Discharge: 2012-03-05 | Disposition: A | Discharge status: Home / Self Care | Payer: Self-pay | Attending: Emergency Medicine | Admitting: Emergency Medicine

## 2012-03-05 ENCOUNTER — Encounter (HOSPITAL_COMMUNITY): Payer: Self-pay | Admitting: *Deleted

## 2012-03-05 DIAGNOSIS — R112 Nausea with vomiting, unspecified: Secondary | ICD-10-CM | POA: Insufficient documentation

## 2012-03-05 DIAGNOSIS — R109 Unspecified abdominal pain: Secondary | ICD-10-CM | POA: Insufficient documentation

## 2012-03-05 DIAGNOSIS — R10819 Abdominal tenderness, unspecified site: Secondary | ICD-10-CM | POA: Insufficient documentation

## 2012-03-05 DIAGNOSIS — N509 Disorder of male genital organs, unspecified: Secondary | ICD-10-CM | POA: Insufficient documentation

## 2012-03-05 HISTORY — DX: Diverticulitis of intestine, part unspecified, without perforation or abscess without bleeding: K57.92

## 2012-03-05 LAB — URINALYSIS, ROUTINE W REFLEX MICROSCOPIC
Bilirubin Urine: NEGATIVE
Hgb urine dipstick: NEGATIVE
Ketones, ur: NEGATIVE mg/dL
Specific Gravity, Urine: 1.019 (ref 1.005–1.030)
pH: 6.5 (ref 5.0–8.0)

## 2012-03-05 LAB — DIFFERENTIAL
Basophils Absolute: 0 10*3/uL (ref 0.0–0.1)
Basophils Relative: 0 % (ref 0–1)
Neutro Abs: 1.4 10*3/uL — ABNORMAL LOW (ref 1.7–7.7)
Neutrophils Relative %: 39 % — ABNORMAL LOW (ref 43–77)

## 2012-03-05 LAB — CBC
MCHC: 33.9 g/dL (ref 30.0–36.0)
RDW: 13.3 % (ref 11.5–15.5)

## 2012-03-05 LAB — COMPREHENSIVE METABOLIC PANEL
AST: 30 U/L (ref 0–37)
Albumin: 3.8 g/dL (ref 3.5–5.2)
Alkaline Phosphatase: 94 U/L (ref 39–117)
Chloride: 104 mEq/L (ref 96–112)
Potassium: 3.8 mEq/L (ref 3.5–5.1)
Sodium: 138 mEq/L (ref 135–145)
Total Bilirubin: 0.3 mg/dL (ref 0.3–1.2)

## 2012-03-05 MED ORDER — SODIUM CHLORIDE 0.9 % IV BOLUS (SEPSIS)
1000.0000 mL | Freq: Once | INTRAVENOUS | Status: AC
  Administered 2012-03-05: 1000 mL via INTRAVENOUS

## 2012-03-05 MED ORDER — HYDROMORPHONE HCL PF 1 MG/ML IJ SOLN
1.0000 mg | Freq: Once | INTRAMUSCULAR | Status: AC
  Administered 2012-03-05: 1 mg via INTRAVENOUS
  Filled 2012-03-05: qty 1

## 2012-03-05 MED ORDER — DICYCLOMINE HCL 20 MG PO TABS: 20.0000 mg | ORAL_TABLET | Freq: Two times a day (BID) | ORAL | Status: AC

## 2012-03-05 MED ORDER — ONDANSETRON HCL 4 MG/2ML IJ SOLN
4.0000 mg | Freq: Once | INTRAMUSCULAR | Status: AC
  Administered 2012-03-05: 4 mg via INTRAVENOUS
  Filled 2012-03-05: qty 2

## 2012-03-05 MED ORDER — TRAMADOL HCL 50 MG PO TABS: 50.0000 mg | ORAL_TABLET | Freq: Four times a day (QID) | ORAL | Status: AC | PRN

## 2012-03-05 NOTE — ED Provider Notes (Signed)
History     CSN: [id number]  Arrival date & time 03/05/12  2952   First MD Initiated Contact with Patient 03/05/12 0658     7:02 AM HPI Charles Potts is a 26 y.o. male complaining of left-sided abdominal pain. Reports pain radiates to the left testicle. Associated with dysuria, nausea, vomiting, elevated temperature of 100. States he was recently seen here the beginning of April with similar symptoms. States this resolved and pain began again this morning at approximately 2 AM. Reports he never followed up with provided referral. States he is in the [employer] and is waiting to go back to [place] to follow up with GI M.D. in the [employer]. Reports he completed treatment for gonorrhea and Chlamydia. Patient is a 26 y.o. male presenting with abdominal pain and testicular pain. The history is provided by the patient.  Abdominal Pain The primary symptoms of the illness include abdominal pain, nausea and vomiting. The primary symptoms of the illness do not include fever, shortness of breath, diarrhea or dysuria. The current episode started 3 to 5 hours ago. The onset of the illness was sudden. The problem has not changed since onset. The pain came on suddenly. The abdominal pain has been unchanged since its onset. The abdominal pain is located in the LUQ and LLQ. Pain radiation: Left testicle. The abdominal pain is relieved by nothing. The abdominal pain is exacerbated by urination and movement (Palpation).  Symptoms associated with the illness do not include chills, constipation, urgency, hematuria, frequency or back pain.  Testicle Pain This is a new problem. The current episode started today. The problem has been unchanged. Associated symptoms include abdominal pain, nausea and vomiting. Pertinent negatives include no chest pain, chills, fever, headaches, numbness or weakness. He has tried nothing for the symptoms.    Past Medical History  Diagnosis Date  . Diverticulosis   . Diverticulitis      Past Surgical History  Procedure Date  . Appendectomy   . Cholecystectomy     No family history on file.  History  Substance Use Topics  . Smoking status: Never Smoker   . Smokeless tobacco: Not on file  . Alcohol Use: No      Review of Systems  Constitutional: Negative for fever and chills.  Respiratory: Negative for shortness of breath.   Cardiovascular: Negative for chest pain.  Gastrointestinal: Positive for nausea, vomiting and abdominal pain. Negative for diarrhea, constipation, blood in stool and rectal pain.  Genitourinary: Positive for testicular pain. Negative for dysuria, urgency, frequency, hematuria, flank pain, discharge, penile swelling, scrotal swelling, genital sores and penile pain.  Musculoskeletal: Negative for back pain.  Neurological: Negative for dizziness, weakness, numbness and headaches.  All other systems reviewed and are negative.    Allergies  Shrimp; Demerol; Toradol; Zofran; and Morphine and related  Home Medications  No current outpatient prescriptions on file.  BP 137/51  Pulse 78  Temp(Src) 98.4 F (36.9 C) (Oral)  Resp 19  SpO2 100%  Physical Exam  Constitutional: He is oriented to person, place, and time. He appears well-developed and well-nourished.  HENT:  Head: Normocephalic and atraumatic.  Eyes: Conjunctivae are normal. Pupils are equal, round, and reactive to light.  Neck: Normal range of motion. Neck supple.  Cardiovascular: Normal rate, regular rhythm and normal heart sounds.   Pulmonary/Chest: Effort normal and breath sounds normal.  Abdominal: Soft. Bowel sounds are normal. There is no hepatosplenomegaly. There is tenderness in the left upper quadrant and left lower quadrant. There  is no rigidity, no rebound, no guarding, no CVA tenderness, no tenderness at McBurney's point and negative Murphy's sign.  Genitourinary: Penis normal. Cremasteric reflex is present. Right testis shows no mass, no swelling and no  tenderness. Left testis shows tenderness. Left testis shows no mass and no swelling.  Neurological: He is alert and oriented to person, place, and time.  Skin: Skin is warm and dry. No rash noted. No erythema. No pallor.  Psychiatric: He has a normal mood and affect. His behavior is normal.    ED Course  Procedures Results for orders placed during the hospital encounter of 03/05/12  URINALYSIS, ROUTINE W REFLEX MICROSCOPIC      Component Value Range   Color, Urine YELLOW  YELLOW    APPearance CLEAR  CLEAR    Specific Gravity, Urine 1.019  1.005 - 1.030    pH 6.5  5.0 - 8.0    Glucose, UA NEGATIVE  NEGATIVE (mg/dL)   Hgb urine dipstick NEGATIVE  NEGATIVE    Bilirubin Urine NEGATIVE  NEGATIVE    Ketones, ur NEGATIVE  NEGATIVE (mg/dL)   Protein, ur NEGATIVE  NEGATIVE (mg/dL)   Urobilinogen, UA 0.2  0.0 - 1.0 (mg/dL)   Nitrite NEGATIVE  NEGATIVE    Leukocytes, UA NEGATIVE  NEGATIVE   CBC      Component Value Range   WBC 3.7 (*) 4.0 - 10.5 (K/uL)   RBC 4.45  4.22 - 5.81 (MIL/uL)   Hemoglobin 12.7 (*) 13.0 - 17.0 (g/dL)   HCT 16.1 (*) 09.6 - 52.0 (%)   MCV 84.3  78.0 - 100.0 (fL)   MCH 28.5  26.0 - 34.0 (pg)   MCHC 33.9  30.0 - 36.0 (g/dL)   RDW 04.5  40.9 - 81.1 (%)   Platelets 230  150 - 400 (K/uL)  DIFFERENTIAL      Component Value Range   Neutrophils Relative 39 (*) 43 - 77 (%)   Neutro Abs 1.4 (*) 1.7 - 7.7 (K/uL)   Lymphocytes Relative 51 (*) 12 - 46 (%)   Lymphs Abs 1.9  0.7 - 4.0 (K/uL)   Monocytes Relative 8  3 - 12 (%)   Monocytes Absolute 0.3  0.1 - 1.0 (K/uL)   Eosinophils Relative 2  0 - 5 (%)   Eosinophils Absolute 0.1  0.0 - 0.7 (K/uL)   Basophils Relative 0  0 - 1 (%)   Basophils Absolute 0.0  0.0 - 0.1 (K/uL)  COMPREHENSIVE METABOLIC PANEL      Component Value Range   Sodium 138  135 - 145 (mEq/L)   Potassium 3.8  3.5 - 5.1 (mEq/L)   Chloride 104  96 - 112 (mEq/L)   CO2 22  19 - 32 (mEq/L)   Glucose, Bld 109 (*) 70 - 99 (mg/dL)   BUN 12  6 - 23 (mg/dL)     Creatinine, Ser 9.14  0.50 - 1.35 (mg/dL)   Calcium 9.1  8.4 - 78.2 (mg/dL)   Total Protein 7.1  6.0 - 8.3 (g/dL)   Albumin 3.8  3.5 - 5.2 (g/dL)   AST 30  0 - 37 (U/L)   ALT 53  0 - 53 (U/L)   Alkaline Phosphatase 94  39 - 117 (U/L)   Total Bilirubin 0.3  0.3 - 1.2 (mg/dL)   GFR calc non Af Amer >90  >90 (mL/min)   GFR calc Af Amer >90  >90 (mL/min)  LIPASE, BLOOD      Component Value Range  Lipase 39  11 - 59 (U/L)   US Scrotum  02/18/2012  *RADIOLOGY REPORT*  Clinical Data:  Left testicular pain  SCROTAL ULTRASOUND DOPPLER ULTRASOUND OF THE TESTICLES  Technique: Complete ultrasound examination of the testicles, epididymis, and other scrotal structures was performed.  Color and spectral Doppler ultrasound were also utilized to evaluate blood flow to the testicles.  Comparison:  None  Findings:  Right testis:  4.2 x 1.4 x 2.3 cm.  Normal echogenicity.  No calcification, mass, or cyst.  Internal blood flow present on color Doppler imaging.  Left testis:  3.7 x 1.5 x 2.5 cm.  Normal echogenicity.  No calcifications, mass or cyst.  Internal blood flow present on color Doppler imaging.  Right epididymis:  Normal in size and appearance.  Left epididymis:  Normal size.  Small cyst left epididymis 2 x 4 x 4 mm.  Hydocele:  Small left hydrocele present.  No right hydrocele seen.  Varicocele:  Absent bilaterally  Pulsed Doppler interrogation of both testes demonstrates low resistance symmetric blood flow bilaterally.  No inguinal hernia identified.  IMPRESSION: Tiny epididymal cyst versus spermatocele in left epididymis. Otherwise normal exam.  Original Report Authenticated By: Lollie Marrow, M.D.   Ct Abdomen Pelvis W Contrast  02/13/2012  *RADIOLOGY REPORT*  Clinical Data: Abdominal pain, abnormal radiographs with small bowel dilatation question obstruction  CT ABDOMEN AND PELVIS WITH CONTRAST  Technique:  Multidetector CT imaging of the abdomen and pelvis was performed following the standard protocol  during bolus administration of intravenous contrast. Sagittal and coronal MPR images reconstructed from axial data set.  Contrast:  Dilute oral contrast. 100 ml Omnipaque 300 IV.  Comparison: 02/11/2012 Correlation:  Abdominal radiographs 02/13/2012  Findings: Lung bases clear. Gallbladder and appendix surgically absent by history. Liver, spleen, pancreas, kidneys, and adrenal glands normal. Scattered colonic diverticula with retained dense contrast. Small amount of nonspecific free pelvic fluid. Stomach normal appearance. Few air filled loops of small bowel in the mid abdomen are minimally prominent in size though no definite evidence of obstruction or transition in caliber is seen. No bowel wall thickening, mass, adenopathy, or hernia. Normal-appearing bladder and ureters. Bones unremarkable.  IMPRESSION: A few air filled loops of small bowel in the mid abdomen are minimally prominent in size though no definite evidence of bowel obstruction or transition zone is identified. Scattered colonic diverticulosis. Remainder of exam unchanged.  Original Report Authenticated By: Lollie Marrow, M.D.   [place] Art/ven Flow Abd Pelv Doppler  02/18/2012  *RADIOLOGY REPORT*  Clinical Data:  Left testicular pain  SCROTAL ULTRASOUND DOPPLER ULTRASOUND OF THE TESTICLES  Technique: Complete ultrasound examination of the testicles, epididymis, and other scrotal structures was performed.  Color and spectral Doppler ultrasound were also utilized to evaluate blood flow to the testicles.  Comparison:  None  Findings:  Right testis:  4.2 x 1.4 x 2.3 cm.  Normal echogenicity.  No calcification, mass, or cyst.  Internal blood flow present on color Doppler imaging.  Left testis:  3.7 x 1.5 x 2.5 cm.  Normal echogenicity.  No calcifications, mass or cyst.  Internal blood flow present on color Doppler imaging.  Right epididymis:  Normal in size and appearance.  Left epididymis:  Normal size.  Small cyst left epididymis 2 x 4 x 4 mm.  Hydocele:   Small left hydrocele present.  No right hydrocele seen.  Varicocele:  Absent bilaterally  Pulsed Doppler interrogation of both testes demonstrates low resistance symmetric blood flow bilaterally.  No  inguinal hernia identified.  IMPRESSION: Tiny epididymal cyst versus spermatocele in left epididymis. Otherwise normal exam.  Original Report Authenticated By: Lollie Marrow, M.D.   Dg Abd Acute W/chest  02/13/2012  *RADIOLOGY REPORT*  Clinical Data: Left abdominal pain.  ACUTE ABDOMEN SERIES (ABDOMEN 2 VIEW & CHEST 1 VIEW)  Comparison: CT abdomen pelvis 02/11/2012 and acute abdominal series 01/23/2012.  Findings: Frontal view of the chest shows midline trachea and normal heart size.  Lungs are clear.  No pleural fluid.  Two views of the abdomen show dilated loops of small bowel with associated air fluid levels.  Stool is seen in the majority of the colon.  Retained contrast is seen in numerous colonic diverticula.  IMPRESSION:  Dilated small bowel with associated air fluid levels, indicative of obstruction. Alternatively, findings may be due to constipation.  Original Report Authenticated By: Reyes Ivan, M.D.      MDM   Patient lab all within normal limits. Pain relieved with medication. Advised patient that labs do not indicate need for further imaging studies. normal cremasteric reflex and no bell clapper deformity of testicles. Advised patient that his had multiple options for narcotics since last October. Advised patient that I would not be giving him Bentyl and Ultram without the write prescription for narcotic pain medication. Patient reports understanding and is ready for discharge.       Thomasene Lot, PA-C 03/05/12 [phone]

## 2012-03-05 NOTE — Discharge Instructions (Signed)

## 2012-03-05 NOTE — ED Provider Notes (Signed)
Medical screening examination/treatment/procedure(s) were performed by non-physician practitioner and as supervising physician I was immediately available for consultation/collaboration.   Sigourney Portillo L Julyanna Scholle, MD 03/05/12 1520 

## 2012-03-05 NOTE — ED Notes (Signed)
PT is here with left lower abd pain and radiates down into testicle.  Pt reports vomiting this am.  Pt reports lower left back pain

## 2012-03-31 ENCOUNTER — Emergency Department: Payer: Self-pay | Admitting: Emergency Medicine

## 2012-03-31 LAB — CBC
HCT: 44.2 % (ref 40.0–52.0)
HGB: 14.6 g/dL (ref 13.0–18.0)
MCHC: 33.1 g/dL (ref 32.0–36.0)
MCV: 86 fL (ref 80–100)
RBC: 5.15 10*6/uL (ref 4.40–5.90)
WBC: 3.3 10*3/uL — ABNORMAL LOW (ref 3.8–10.6)

## 2012-03-31 LAB — COMPREHENSIVE METABOLIC PANEL
Albumin: 4.3 g/dL (ref 3.4–5.0)
Alkaline Phosphatase: 103 U/L (ref 50–136)
Anion Gap: 5 — ABNORMAL LOW (ref 7–16)
Bilirubin,Total: 0.4 mg/dL (ref 0.2–1.0)
Chloride: 103 mmol/L (ref 98–107)
Co2: 30 mmol/L (ref 21–32)
Creatinine: 1.08 mg/dL (ref 0.60–1.30)
EGFR (African American): >60
EGFR (Non-African Amer.): >60
Osmolality: 275 (ref 275–301)
Potassium: 3.9 mmol/L (ref 3.5–5.1)
SGOT(AST): 45 U/L — ABNORMAL HIGH (ref 15–37)
SGPT (ALT): 68 U/L
Total Protein: 8.6 g/dL — ABNORMAL HIGH (ref 6.4–8.2)

## 2012-03-31 LAB — URINALYSIS, COMPLETE
Bacteria: NONE SEEN
Nitrite: NEGATIVE
RBC,UR: 452 /HPF (ref 0–5)
Squamous Epithelial: 1

## 2012-03-31 LAB — LIPASE, BLOOD: Lipase: 164 U/L (ref 73–393)

## 2012-04-14 IMAGING — CR DG ABDOMEN ACUTE W/ 1V CHEST
3 series · 3 of 3 positions shown · non-contrast
Comparison: CT abdomen pelvis dated 07/20/2011

CLINICAL DATA: Left lower abdominal pain, nausea/vomiting

ACUTE ABDOMEN SERIES (ABDOMEN 2 VIEW & CHEST 1 VIEW)

[w chest pa]
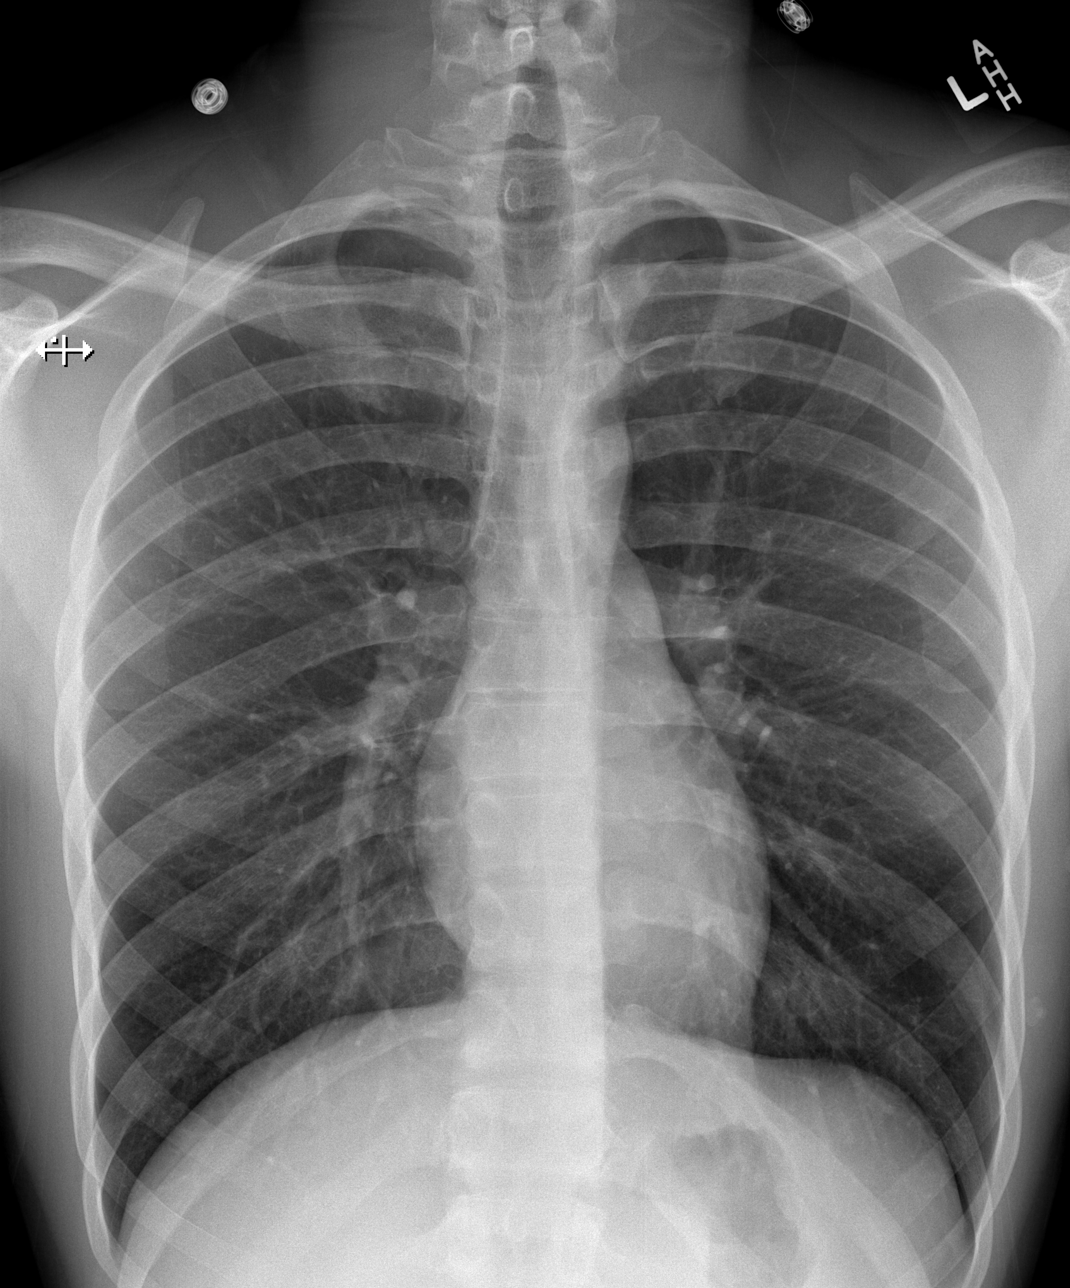

[w abdomen upright]
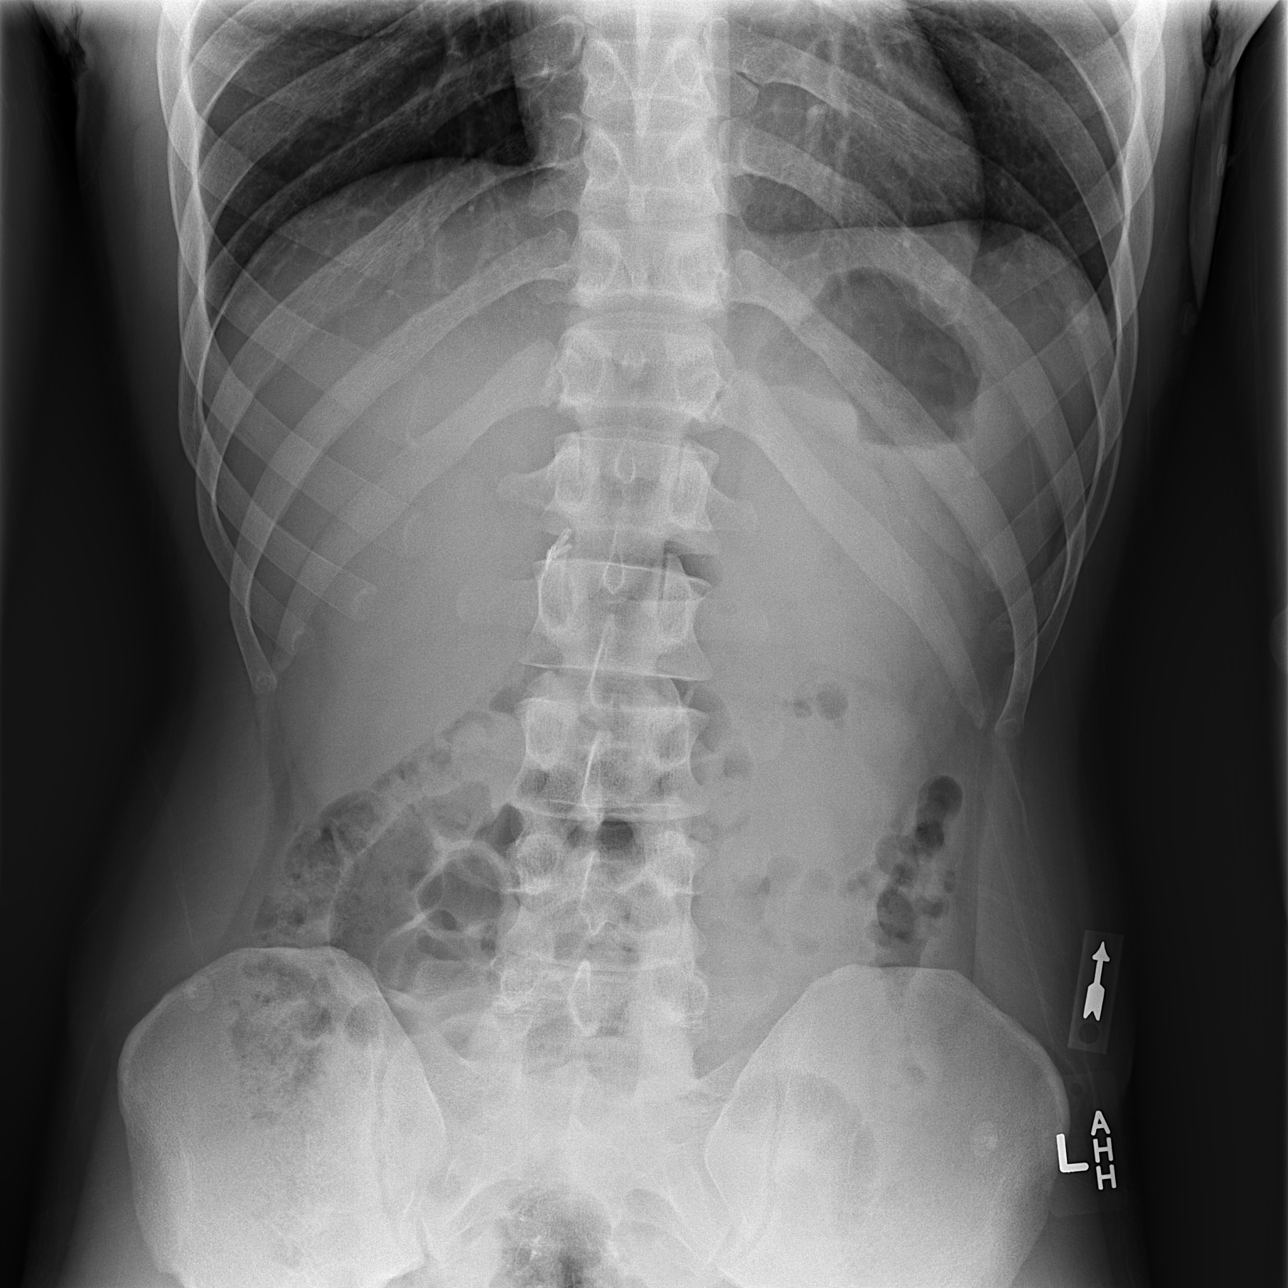

[t abdomen supine]
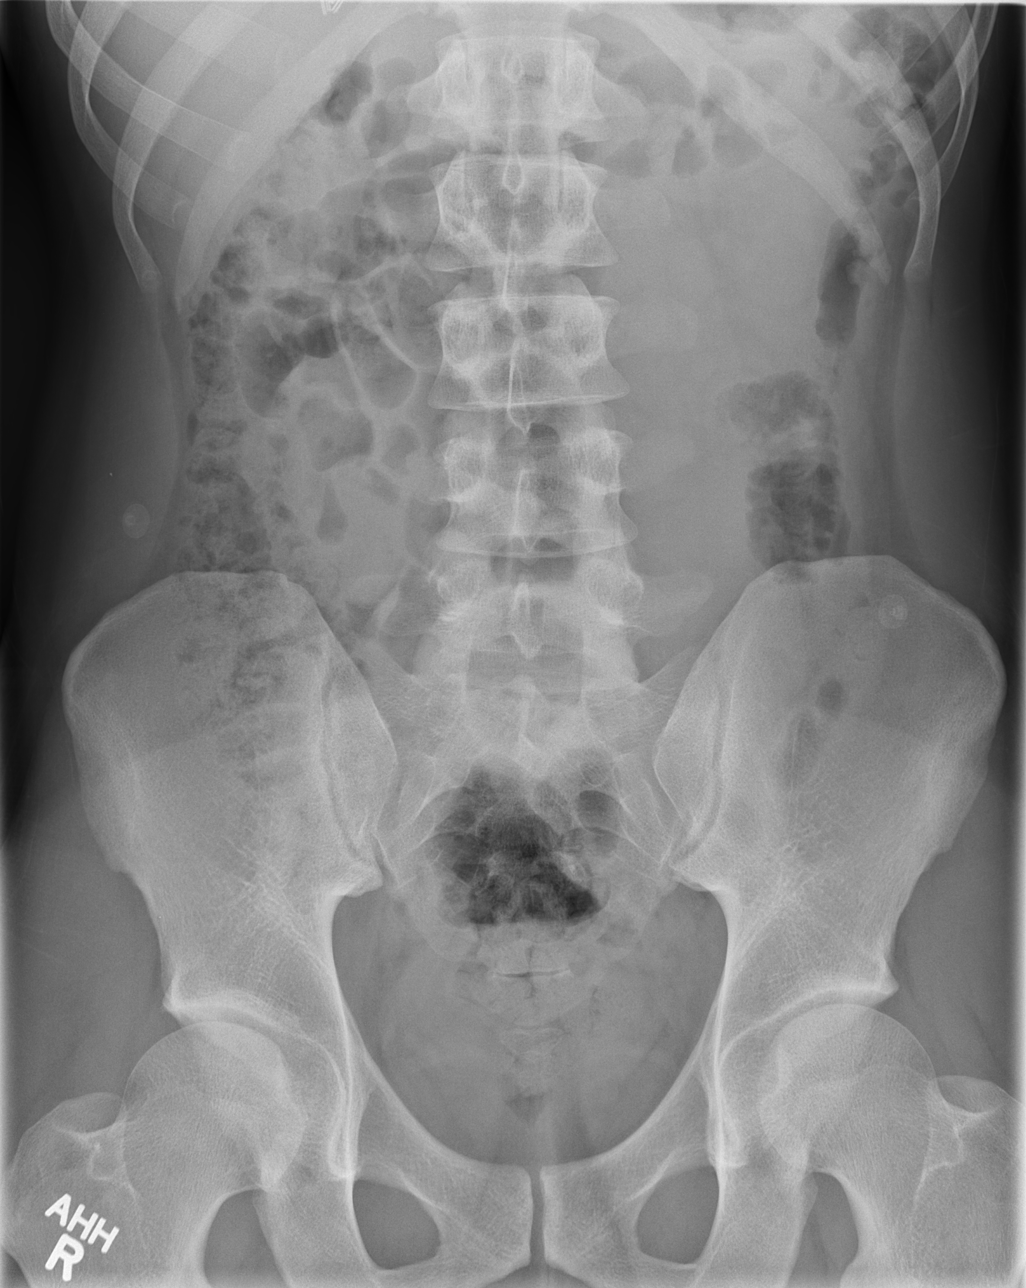

[3 of 3 positions shown; findings below may reference images not displayed]

FINDINGS: Lungs are clear. No pleural effusion or pneumothorax.

Cardiomediastinal silhouette is within normal limits.

Nonobstructive bowel gas pattern.

No evidence of free air under the diaphragm on the upright view.

Visualized osseous structures are within normal limits.
IMPRESSION: No evidence of acute cardiopulmonary disease.

No evidence of small bowel obstruction or free air.

## 2012-05-07 ENCOUNTER — Ambulatory Visit: Payer: Self-pay

## 2012-05-07 ENCOUNTER — Ambulatory Visit: Payer: Self-pay | Admitting: Family Medicine

## 2012-05-07 VITALS — BP 150/77 | HR 75 | Temp 98.0°F | Resp 16 | Ht 73.5 in | Wt 191.0 lb

## 2012-05-07 DIAGNOSIS — R300 Dysuria: Secondary | ICD-10-CM

## 2012-05-07 DIAGNOSIS — R319 Hematuria, unspecified: Secondary | ICD-10-CM

## 2012-05-07 DIAGNOSIS — M545 Low back pain: Secondary | ICD-10-CM

## 2012-05-07 DIAGNOSIS — R109 Unspecified abdominal pain: Secondary | ICD-10-CM

## 2012-05-07 LAB — POCT URINALYSIS DIPSTICK
Bilirubin, UA: NEGATIVE
Nitrite, UA: NEGATIVE
pH, UA: 6

## 2012-05-07 LAB — POCT UA - MICROSCOPIC ONLY
Casts, Ur, LPF, POC: NEGATIVE
Mucus, UA: NEGATIVE
WBC, Ur, HPF, POC: NEGATIVE
Yeast, UA: NEGATIVE

## 2012-05-07 MED ORDER — HYDROCODONE-ACETAMINOPHEN 5-325 MG PO TABS: 1.0000 | ORAL_TABLET | Freq: Four times a day (QID) | ORAL | Status: AC | PRN

## 2012-05-07 MED ORDER — TAMSULOSIN HCL 0.4 MG PO CAPS: 0.4000 mg | ORAL_CAPSULE | Freq: Every day | ORAL | Status: DC

## 2012-05-07 MED ORDER — MELOXICAM 15 MG PO TABS: 15.0000 mg | ORAL_TABLET | Freq: Every day | ORAL | Status: AC

## 2012-05-07 MED ORDER — CIPROFLOXACIN HCL 500 MG PO TABS: 500.0000 mg | ORAL_TABLET | Freq: Two times a day (BID) | ORAL | Status: AC

## 2012-05-07 NOTE — Progress Notes (Signed)
  Subjective:    Patient ID: Vinod Mikesell, male    DOB: 29-Mar-1986, 26 y.o.   MRN: [id number]  HPI  Presents for evaluation of left sided flank pain and dysuria that began last night.  No history of this previously. Began with left low back pain and radiates around to the groin.  Thinks he sees blood in his urine.  No penile discharge. Monogamous with one male sex partner, negative GC/CT in April.  No nausea, vomiting, fever, chills.  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  He's home on leave for a week  Review of Systems As above.    Objective:   Physical Exam  Vital signs noted. Well-developed, well nourished BM who is awake, alert and oriented, in NAD. HEENT: [place]/AT, sclera and conjunctiva are clear.   Neck: supple, non-tender, no lymphadenopathy, thyromegaly. Heart: RRR, no murmur Lungs: CTA Abdomen: normo-active bowel sounds, supple, no mass or organomegaly. Tender with palpation of the left flank. Left sided CVA tenderness. Extremities: no cyanosis, clubbing or edema. Skin: warm and dry without rash.  Results for orders placed in visit on 05/07/12  POCT UA - MICROSCOPIC ONLY      Component Value Range   WBC, Ur, HPF, POC neg     RBC, urine, microscopic tntc     Bacteria, U Microscopic neg     Mucus, UA neg     Epithelial cells, urine per micros 0-1     Crystals, Ur, HPF, POC neg     Casts, Ur, LPF, POC neg     Yeast, UA neg    POCT URINALYSIS DIPSTICK      Component Value Range   Color, UA dark yellow     Clarity, UA cloudy     Glucose, UA neg     Bilirubin, UA neg     Ketones, UA neg     Spec Grav, UA >=1.030     Blood, UA large     pH, UA 6.0     Protein, UA 30mg      Urobilinogen, UA 0.2     Nitrite, UA neg     Leukocytes, UA Negative     [facility] reading (PRIMARY) by  Dr. Patsy Lager.  No nephrolithiasis seen.  Possible constipation. No acute findings.     Assessment & Plan:   1. Blood in urine  POCT UA - Microscopic  Only, POCT urinalysis dipstick, ciprofloxacin (CIPRO) 500 MG tablet, Tamsulosin HCl (FLOMAX) 0.4 MG CAPS  2. Dysuria  POCT UA - Microscopic Only, POCT urinalysis dipstick, Urine culture, DG Abd 1 View, ciprofloxacin (CIPRO) 500 MG tablet, Tamsulosin HCl (FLOMAX) 0.4 MG CAPS  3. LBP (low back pain)  POCT UA - Microscopic Only, POCT urinalysis dipstick, HYDROcodone-acetaminophen (NORCO) 5-325 MG per tablet, meloxicam (MOBIC) 15 MG tablet  4. Flank pain     Suspect nephrolithiasis.   Discussed with Dr. Patsy Lager.  Patient Instructions  Drink LOTS of water. Strain your urine until the stone passes.  If the stone has not passed and your symptoms persist in 7 days, return for re-evaluation. If your symptoms worsen in the meantime, or if you develop new symptoms, return for re-evaluation.

## 2012-05-07 NOTE — Patient Instructions (Signed)
Drink LOTS of water. Strain your urine until the stone passes.  If the stone has not passed and your symptoms persist in 7 days, return for re-evaluation. If your symptoms worsen in the meantime, or if you develop new symptoms, return for re-evaluation.

## 2012-05-10 LAB — URINE CULTURE

## 2012-05-13 ENCOUNTER — Telehealth: Payer: Self-pay

## 2012-05-13 NOTE — Telephone Encounter (Signed)
Pt CB and I gave him results of UA. Pt stated he is a little better since starting med. Advised pt to finish medication and RTC or seek Tx where he is if not resolved. Pt agreed.

## 2012-05-30 ENCOUNTER — Emergency Department: Payer: Self-pay | Admitting: Emergency Medicine

## 2012-05-30 LAB — COMPREHENSIVE METABOLIC PANEL
Alkaline Phosphatase: 114 U/L (ref 50–136)
BUN: 14 mg/dL (ref 7–18)
Bilirubin,Total: 0.6 mg/dL (ref 0.2–1.0)
Calcium, Total: 8.7 mg/dL (ref 8.5–10.1)
EGFR (African American): >60
EGFR (Non-African Amer.): >60
Glucose: 122 mg/dL — ABNORMAL HIGH (ref 65–99)
Potassium: 3.5 mmol/L (ref 3.5–5.1)
Sodium: 141 mmol/L (ref 136–145)
Total Protein: 8 g/dL (ref 6.4–8.2)

## 2012-05-30 LAB — URINALYSIS, COMPLETE
Bacteria: NONE SEEN
Bilirubin,UR: NEGATIVE
Glucose,UR: NEGATIVE mg/dL (ref 0–75)
Nitrite: NEGATIVE
Ph: 6 (ref 4.5–8.0)
Specific Gravity: 1.029 (ref 1.003–1.030)
Squamous Epithelial: <1

## 2012-05-30 LAB — CBC
HCT: 40.6 % (ref 40.0–52.0)
HGB: 13.6 g/dL (ref 13.0–18.0)
MCH: 28.6 pg (ref 26.0–34.0)
MCV: 86 fL (ref 80–100)
RBC: 4.75 10*6/uL (ref 4.40–5.90)

## 2012-06-23 ENCOUNTER — Emergency Department (HOSPITAL_BASED_OUTPATIENT_CLINIC_OR_DEPARTMENT_OTHER)
Admission: EM | Admit: 2012-06-23 | Discharge: 2012-06-23 | Disposition: A | Discharge status: Home / Self Care | Payer: Self-pay | Attending: Emergency Medicine | Admitting: Emergency Medicine

## 2012-06-23 ENCOUNTER — Emergency Department
Admission: EM | Admit: 2012-06-23 | Discharge: 2012-06-23 | Discharge status: Absent without leave | Payer: Self-pay | Source: Home / Self Care

## 2012-06-23 ENCOUNTER — Encounter (HOSPITAL_BASED_OUTPATIENT_CLINIC_OR_DEPARTMENT_OTHER): Payer: Self-pay | Admitting: *Deleted

## 2012-06-23 DIAGNOSIS — Z886 Allergy status to analgesic agent status: Secondary | ICD-10-CM | POA: Insufficient documentation

## 2012-06-23 DIAGNOSIS — Z9089 Acquired absence of other organs: Secondary | ICD-10-CM | POA: Insufficient documentation

## 2012-06-23 DIAGNOSIS — N23 Unspecified renal colic: Secondary | ICD-10-CM | POA: Insufficient documentation

## 2012-06-23 DIAGNOSIS — Z91013 Allergy to seafood: Secondary | ICD-10-CM | POA: Insufficient documentation

## 2012-06-23 DIAGNOSIS — Z87442 Personal history of urinary calculi: Secondary | ICD-10-CM | POA: Insufficient documentation

## 2012-06-23 HISTORY — DX: Disorder of kidney and ureter, unspecified: N28.9

## 2012-06-23 LAB — URINALYSIS, ROUTINE W REFLEX MICROSCOPIC
Leukocytes, UA: NEGATIVE
Nitrite: NEGATIVE
Specific Gravity, Urine: 1.034 — ABNORMAL HIGH (ref 1.005–1.030)
Urobilinogen, UA: 1 mg/dL (ref 0.0–1.0)

## 2012-06-23 LAB — URINE MICROSCOPIC-ADD ON

## 2012-06-23 MED ORDER — ONDANSETRON HCL 4 MG PO TABS: 4.0000 mg | ORAL_TABLET | Freq: Four times a day (QID) | ORAL | Status: AC

## 2012-06-23 MED ORDER — IBUPROFEN 800 MG PO TABS: 800.0000 mg | ORAL_TABLET | Freq: Three times a day (TID) | ORAL | Status: AC

## 2012-06-23 NOTE — ED Notes (Signed)
Lower back pain since yesterday afternoon. States he has a history of kidney stones and this pain is similar.

## 2012-06-23 NOTE — Discharge Instructions (Signed)
NARCOTICS CANNOT BE PRESCRIBED SECONDARY TO YOUR MULTIPLE PRESCRIPTIONS AS REPORTED BY THE [place] CONTROLLED SUBSTANCES DATA BASE. TAKE IBUPROFEN FOR PAIN AND ZOFRAN FOR ANY NAUSEA AND FOLLOW UP WITH UROLOGY.

## 2012-06-23 NOTE — ED Provider Notes (Signed)
History     CSN: [id number]  Arrival date & time 06/23/12  1337   First MD Initiated Contact with Patient 06/23/12 1531      Chief Complaint  Patient presents with  . Back Pain    (Consider location/radiation/quality/duration/timing/severity/associated sxs/prior treatment) Patient is a 26 y.o. male presenting with back pain. The history is provided by the patient.  Back Pain  This is a new problem. The current episode started yesterday. The problem occurs constantly. Pertinent negatives include no fever. Associated symptoms comments: Right sided back pain that radiates to RLQ abdomen, not to groin. He reports a history of kidney stones, all passed without intervention, and states that symptoms are similar. Nausea without vomiting. NO fever. No gross hematuria..    Past Medical History  Diagnosis Date  . Diverticulosis   . Diverticulitis   . Renal disorder     Past Surgical History  Procedure Date  . Appendectomy   . Cholecystectomy     Family History  Problem Relation Age of Onset  . Hypertension Mother   . Hypertension Father   . Mental retardation Brother     History  Substance Use Topics  . Smoking status: Never Smoker   . Smokeless tobacco: Not on file  . Alcohol Use: No      Review of Systems  Constitutional: Negative for fever and chills.  HENT: Negative.   Respiratory: Negative.   Cardiovascular: Negative.   Gastrointestinal: Positive for nausea and vomiting.  Genitourinary: Positive for flank pain. Negative for hematuria.  Musculoskeletal: Positive for back pain.  Skin: Negative.   Neurological: Negative.     Allergies  Shrimp; Demerol; Toradol; Zofran; Morphine and related; and Ultram  Home Medications   Current Outpatient Rx  Name Route Sig Dispense Refill  . DICYCLOMINE HCL 20 MG PO TABS Oral Take 1 tablet (20 mg total) by mouth 2 (two) times daily. 20 tablet 0  . MELOXICAM 15 MG PO TABS Oral Take 1 tablet (15 mg total) by mouth daily. 30  tablet 0  . TAMSULOSIN HCL 0.4 MG PO CAPS Oral Take 1 capsule (0.4 mg total) by mouth daily after supper. 30 capsule 0    BP 140/76  Pulse 74  Temp 98.2 F (36.8 C) (Oral)  Resp 20  SpO2 100%  Physical Exam  Constitutional: He appears well-developed and well-nourished.  HENT:  Head: Normocephalic.  Neck: Normal range of motion. Neck supple.  Cardiovascular: Normal rate and regular rhythm.   Pulmonary/Chest: Effort normal and breath sounds normal.  Abdominal: Soft. Bowel sounds are normal. There is no tenderness. There is no rebound and no guarding.       Well healed linear surgical scars to abdominal wall c/w laporoscopic cholecystectomy and appendectomy.  Genitourinary:       Mild right CVA tenderness.  Musculoskeletal: Normal range of motion.  Neurological: He is alert. No cranial nerve deficit.  Skin: Skin is warm and dry. No rash noted.  Psychiatric: He has a normal mood and affect.    ED Course  Procedures (including critical care time)  Labs Reviewed  URINALYSIS, ROUTINE W REFLEX MICROSCOPIC - Abnormal; Notable for the following:    APPearance CLOUDY (*)     Specific Gravity, Urine 1.034 (*)     Hgb urine dipstick LARGE (*)     All other components within normal limits  URINE MICROSCOPIC-ADD ON - Abnormal; Notable for the following:    Squamous Epithelial / LPF FEW (*)     Bacteria,  UA FEW (*)     All other components within normal limits   No results found.   No diagnosis found.  1. Renal colic   MDM  All passable stones per patient history. Multiple visits for similar pain in ED, with one finding of tiny calculi in kidney and no ureteral stones. [place] Controlled Substances report show multiple narcotic prescriptions, #138 pills in July, #217 in June. Will offer tylenol, antiemetic and refer to urology.       Rodena Medin, PA-C 06/23/12 [phone]

## 2012-06-23 NOTE — ED Provider Notes (Signed)
Medical screening examination/treatment/procedure(s) were performed by non-physician practitioner and as supervising physician I was immediately available for consultation/collaboration.  Ethelda Chick, MD 06/23/12 [phone]

## 2012-06-25 ENCOUNTER — Ambulatory Visit: Payer: Self-pay

## 2012-07-14 ENCOUNTER — Emergency Department (HOSPITAL_COMMUNITY)
Admission: EM | Admit: 2012-07-14 | Discharge: 2012-07-15 | Discharge status: Against Medical Advice | Payer: Self-pay | Attending: Emergency Medicine | Admitting: Emergency Medicine

## 2012-07-14 ENCOUNTER — Emergency Department (HOSPITAL_COMMUNITY): Payer: Self-pay

## 2012-07-14 ENCOUNTER — Encounter (HOSPITAL_COMMUNITY): Payer: Self-pay | Admitting: *Deleted

## 2012-07-14 DIAGNOSIS — R110 Nausea: Secondary | ICD-10-CM | POA: Insufficient documentation

## 2012-07-14 DIAGNOSIS — R109 Unspecified abdominal pain: Secondary | ICD-10-CM | POA: Insufficient documentation

## 2012-07-14 NOTE — ED Notes (Signed)
Pt reporting pain in right flank radiating around to front of abdomen.  Reporting mild nausea, and possible blood in urine. Pt reporting history of kidney stones, and states this does feel similar.

## 2012-07-15 ENCOUNTER — Emergency Department (HOSPITAL_COMMUNITY): Payer: Self-pay

## 2012-07-15 LAB — URINALYSIS, ROUTINE W REFLEX MICROSCOPIC
Bilirubin Urine: NEGATIVE
Glucose, UA: NEGATIVE mg/dL
pH: 6 (ref 5.0–8.0)

## 2012-07-15 LAB — URINE MICROSCOPIC-ADD ON

## 2012-07-15 NOTE — ED Provider Notes (Signed)
Medical screening examination/treatment/procedure(s) were performed by non-physician practitioner and as supervising physician I was immediately available for consultation/collaboration.  Pt left after triage and before work up could be initiated. History and physical performed by Mr Ayham Word Larena Glassman, MD 07/15/12 [phone]

## 2012-07-15 NOTE — ED Notes (Signed)
Xray to exam room, pt not in room, gown lying on bed.

## 2012-07-30 ENCOUNTER — Emergency Department (INDEPENDENT_AMBULATORY_CARE_PROVIDER_SITE_OTHER): Payer: Self-pay

## 2012-07-30 ENCOUNTER — Encounter: Payer: Self-pay | Admitting: *Deleted

## 2012-07-30 ENCOUNTER — Emergency Department (INDEPENDENT_AMBULATORY_CARE_PROVIDER_SITE_OTHER)
Admission: EM | Admit: 2012-07-30 | Discharge: 2012-07-30 | Disposition: A | Discharge status: Home / Self Care | Payer: Self-pay | Source: Home / Self Care

## 2012-07-30 DIAGNOSIS — N200 Calculus of kidney: Secondary | ICD-10-CM

## 2012-07-30 DIAGNOSIS — R319 Hematuria, unspecified: Secondary | ICD-10-CM

## 2012-07-30 DIAGNOSIS — R300 Dysuria: Secondary | ICD-10-CM

## 2012-07-30 DIAGNOSIS — M549 Dorsalgia, unspecified: Secondary | ICD-10-CM

## 2012-07-30 LAB — POCT URINALYSIS DIP (MANUAL ENTRY)
Glucose, UA: NEGATIVE
Nitrite, UA: NEGATIVE
Protein Ur, POC: 30
Urobilinogen, UA: 1 (ref 0–1)

## 2012-07-30 MED ORDER — TAMSULOSIN HCL 0.4 MG PO CAPS: 0.4000 mg | ORAL_CAPSULE | Freq: Every day | ORAL | Status: DC

## 2012-07-30 NOTE — ED Notes (Signed)
Pt c/o RT side low back pain and hematuria x this morning. No OTC meds. Denies fever.

## 2012-07-30 NOTE — ED Provider Notes (Signed)
History     CSN: [id number]  Arrival date & time 07/30/12  1858   None     Chief Complaint  Patient presents with  . Hematuria  . Back Pain   HPI Comments: Patient reports a history of recurrent kidney stones. Patient was recent seen for renal colic and August in the emergency room. Patient was given a prescription for Flomax as well as anti-inflammatories. Patient states that he did not fill Flomax.  A urology referral was also made at this time. Patient states that he has a urologist in [place] that he sees for this. Patient states he has an appointment in the next 3-4 days. Patient states that symptoms are classic for previous bouts of kidney stones. Pain is in the right flank with radiation to the right groin. Patient denies any dysuria or increased urinary frequency. Positive nausea. No fevers. Patient does have a strainer at home that was given to him by his urologist. Patient states she's been told he has a history of making calcium stones. Patient denies passing any stones recently.   Patient is a 26 y.o. male presenting with flank pain. The history is provided by the patient.  Flank Pain This is a recurrent problem. The current episode started yesterday. The problem occurs constantly. The problem has been gradually worsening.    Past Medical History  Diagnosis Date  . Diverticulosis   . Diverticulitis   . Renal disorder     Past Surgical History  Procedure Date  . Appendectomy   . Cholecystectomy     Family History  Problem Relation Age of Onset  . Hypertension Mother   . Hypertension Father   . Mental retardation Brother     History  Substance Use Topics  . Smoking status: Never Smoker   . Smokeless tobacco: Not on file  . Alcohol Use: No      Review of Systems  Genitourinary: Positive for flank pain.  All other systems reviewed and are negative.    Allergies  Shrimp; Demerol; Toradol; Zofran; Morphine and related; and Ultram  Home  Medications   Current Outpatient Rx  Name Route Sig Dispense Refill  . DICYCLOMINE HCL 20 MG PO TABS Oral Take 1 tablet (20 mg total) by mouth 2 (two) times daily. 20 tablet 0  . MELOXICAM 15 MG PO TABS Oral Take 1 tablet (15 mg total) by mouth daily. 30 tablet 0  . TAMSULOSIN HCL 0.4 MG PO CAPS Oral Take 1 capsule (0.4 mg total) by mouth daily after supper. 30 capsule 0    BP 147/84  Pulse 67  Temp 98.7 F (37.1 C) (Oral)  Resp 18  Ht 6\' 3"  (1.905 m)  Wt 190 lb (86.183 kg)  BMI 23.75 kg/m2  SpO2 100%  Physical Exam  Constitutional: He appears well-developed and well-nourished.  HENT:  Head: Normocephalic and atraumatic.  Eyes: Conjunctivae normal are normal. Pupils are equal, round, and reactive to light.  Neck: Normal range of motion. Neck supple.  Cardiovascular: Normal rate and regular rhythm.   Pulmonary/Chest: Effort normal and breath sounds normal.  Abdominal: Soft.       Positive right flank tenderness palpation. Positive mild right lower quadrant tenderness radiating from the right flank overlying scar from appendectomy. No suprapubic tenderness.  Musculoskeletal: Normal range of motion.  Neurological: He is alert.    ED Course  Procedures (including critical care time)   Labs Reviewed  POCT URINALYSIS DIP (MANUAL ENTRY)   Dg Abd 1 View  07/30/2012  *RADIOLOGY REPORT*  Clinical Data: Hematuria and back pain  ABDOMEN - 1 VIEW  Comparison: May 07, 2012  Findings: Supine abdomen image obtained. Bowel gas pattern is normal.  No obstruction or free air is seen on this supine examination.  No abnormal calcifications are identified.  There are surgical clips in the gallbladder fossa region.  IMPRESSION: Nonspecific gas pattern.  No abnormal calcifications.   Original Report Authenticated By: Arvin Collard. WOODRUFF III, M.D.      1. Dysuria   2. Blood in urine   3. Kidney stones       MDM  Suspect this is a recurrence of kidney stones. KUB negative for any  focal renal calculi. Will restart patient on Flomax. Encouraged use. Will culture urine. Hold on antibiotics pending urine culture. Encourage followup with urologist. Discussed infectious and general red flags at length. NSAIDs and Tylenol for pain.     The patient and/or caregiver has been counseled thoroughly with regard to treatment plan and/or medications prescribed including dosage, schedule, interactions, rationale for use, and possible side effects and they verbalize understanding. Diagnoses and expected course of recovery discussed and will return if not improved as expected or if the condition worsens. Patient and/or caregiver verbalized understanding.               Doree Albee, MD 07/30/12 2100

## 2012-08-01 LAB — URINE CULTURE: Colony Count: 9000

## 2012-08-02 ENCOUNTER — Telehealth: Payer: Self-pay | Admitting: Family Medicine

## 2013-02-05 IMAGING — US US SCROTUM
1 series · 14 of 25 positions shown · non-contrast
Comparison: None

CLINICAL DATA: Left testicular pain

SCROTAL ULTRASOUND
DOPPLER ULTRASOUND OF THE TESTICLES
TECHNIQUE: Complete ultrasound examination of the testicles,
epididymis, and other scrotal structures was performed.  Color and
spectral Doppler ultrasound were also utilized to evaluate blood
flow to the testicles.

[Series 1: us scrotum · 0.08mm/px · 14 of 82 slices shown]
[im 1/82]
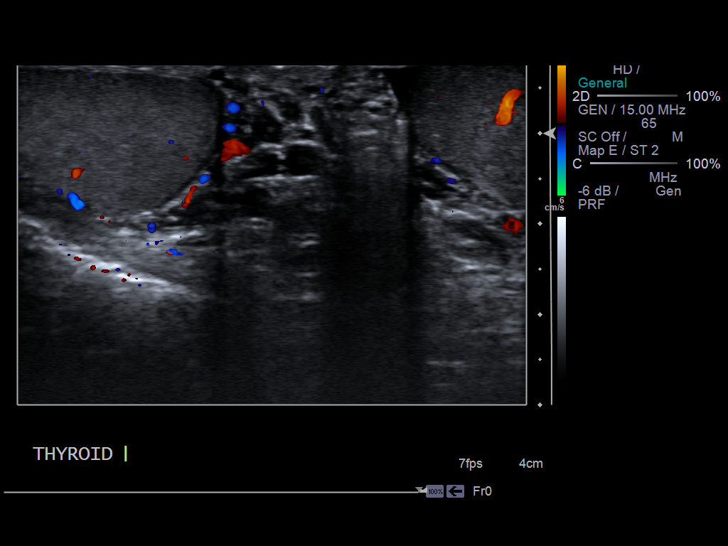
[im 7/82]
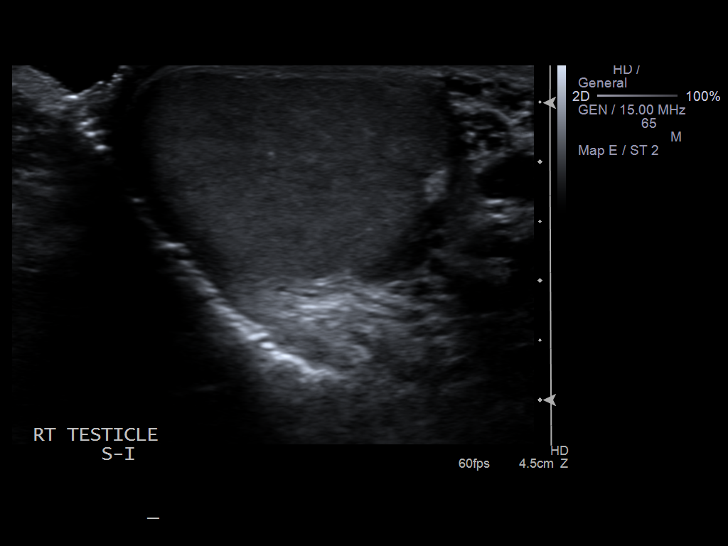
[im 14/82]
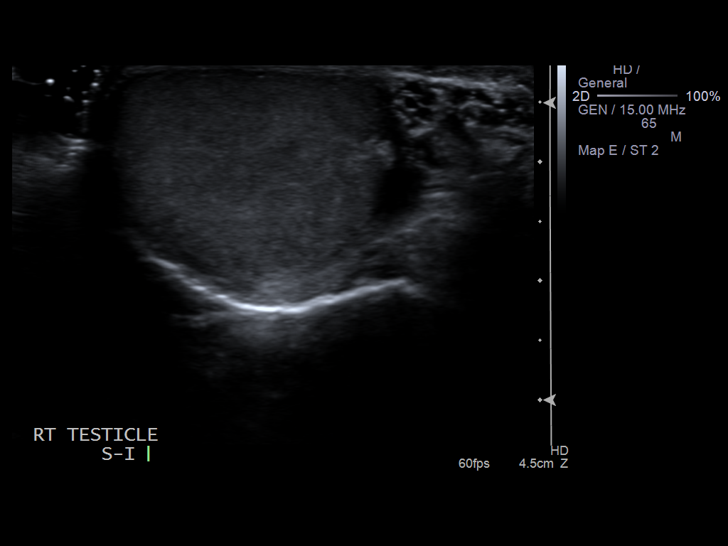
[im 21/82]
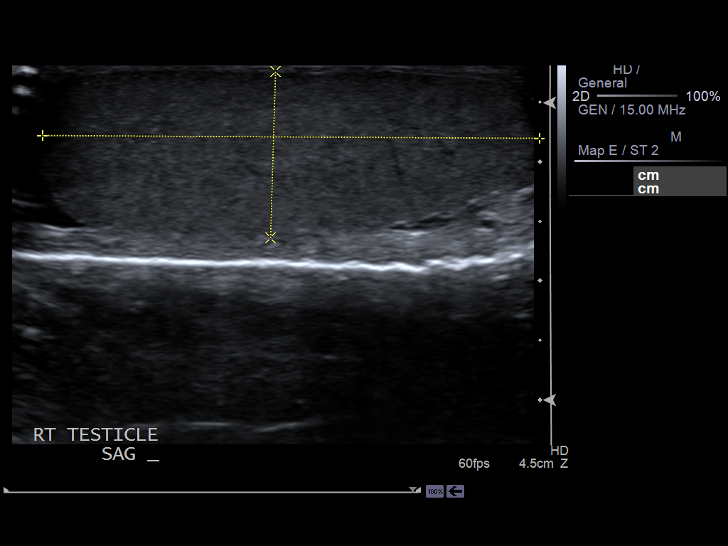
[im 28/82]
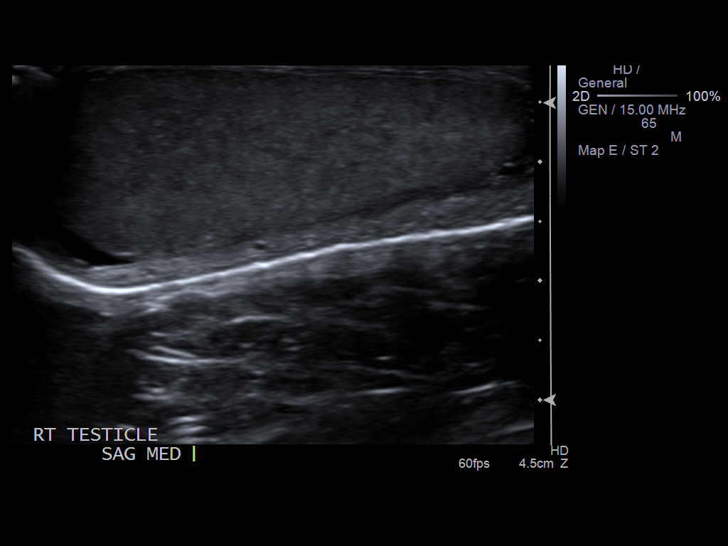
[im 31/82]
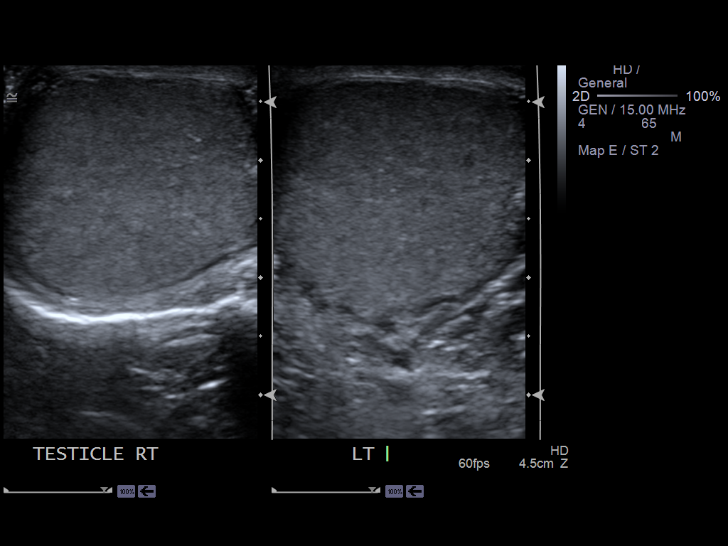
[im 38/82]
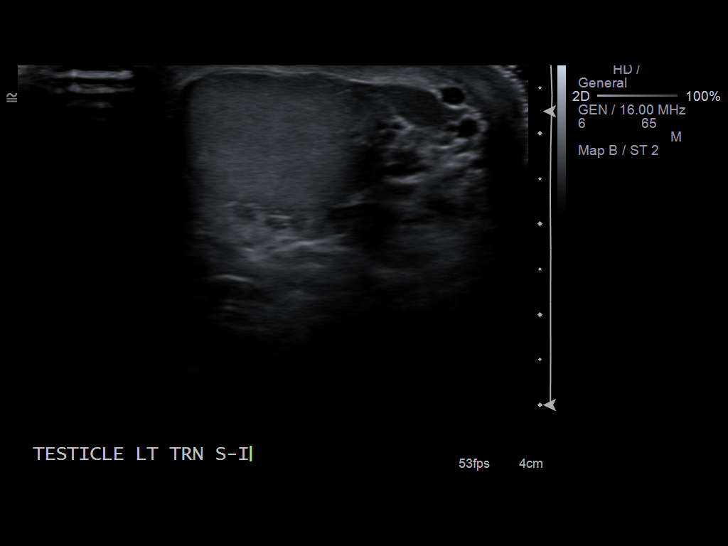
[im 44/82]
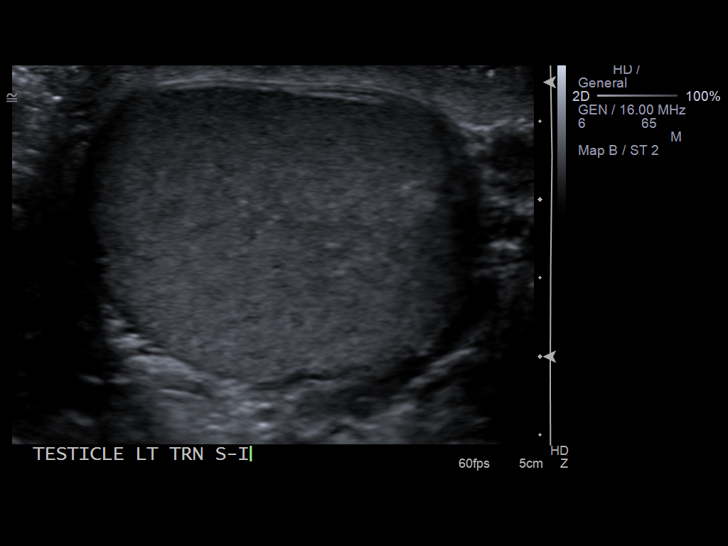
[im 51/82]
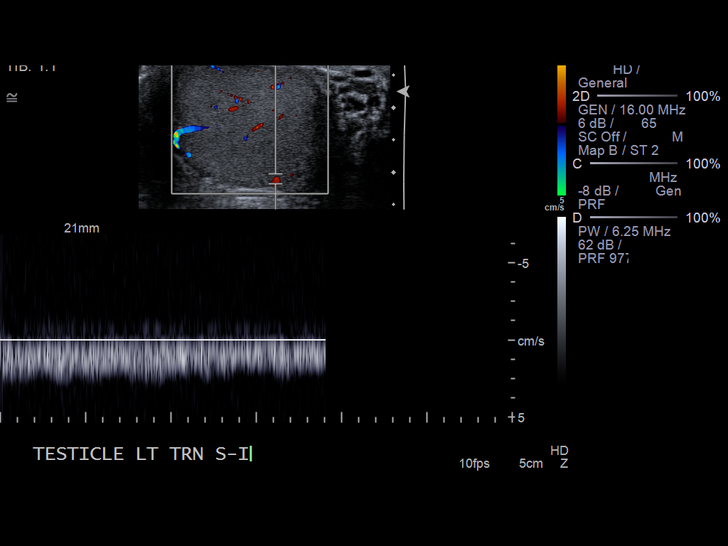
[im 55/82]
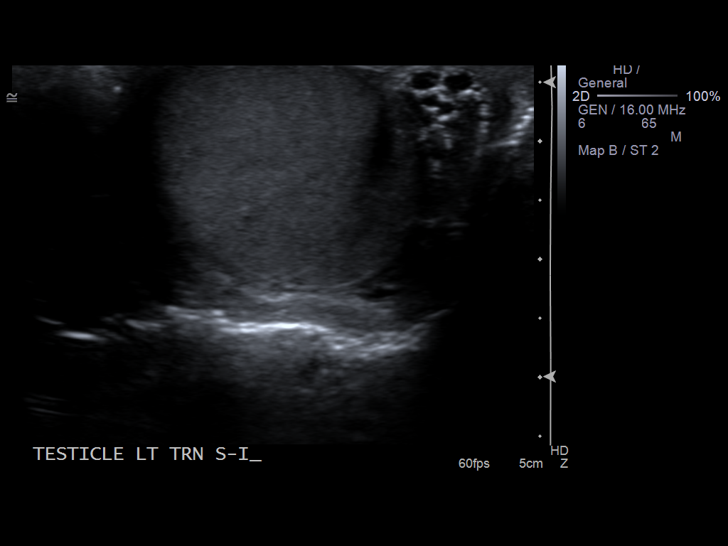
[im 61/82]
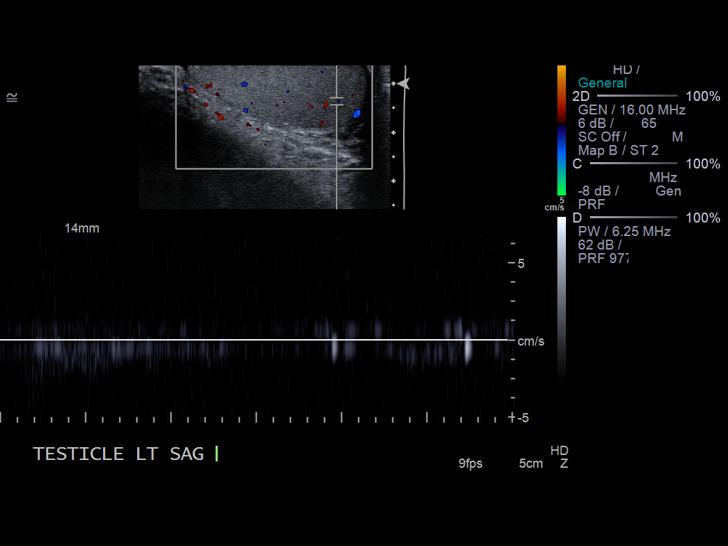
[im 68/82]
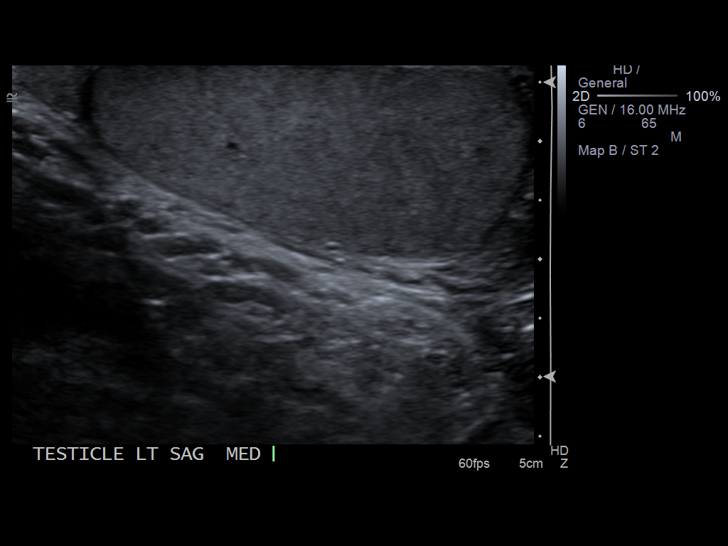
[im 75/82]
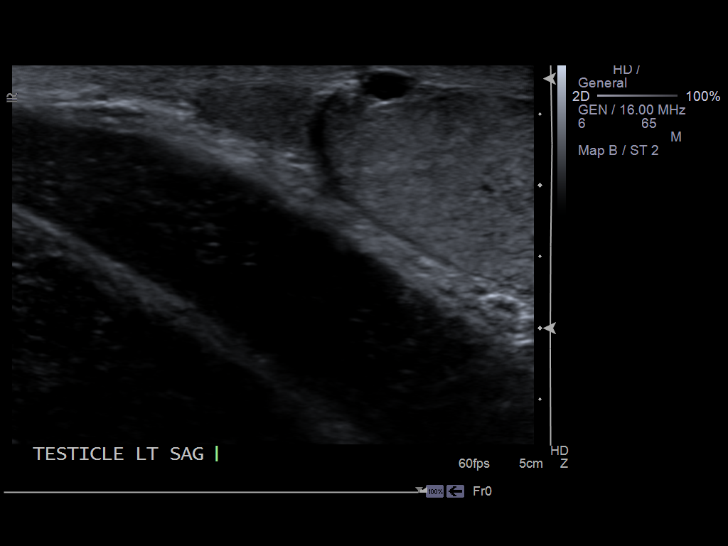
[im 82/82]
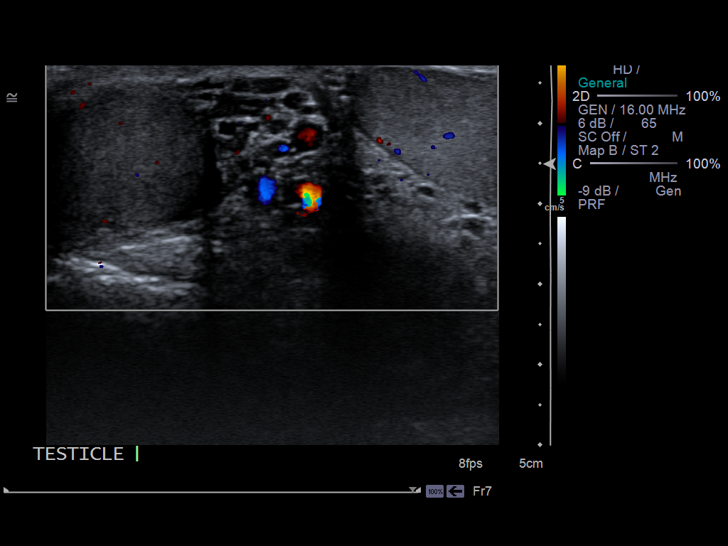

[14 of 25 positions shown; findings below may reference images not displayed]

FINDINGS: Right testis:  4.2 x 1.4 x 2.3 cm.  Normal echogenicity.  No
calcification, mass, or cyst.  Internal blood flow present on color
Doppler imaging.

Left testis:  3.7 x 1.5 x 2.5 cm.  Normal echogenicity.  No
calcifications, mass or cyst.  Internal blood flow present on color
Doppler imaging.

Right epididymis:  Normal in size and appearance.

Left epididymis:  Normal size.  Small cyst left epididymis 2 x 4 x
4 mm.

Hydocele:  Small left hydrocele present.  No right hydrocele seen.

Varicocele:  Absent bilaterally

Pulsed Doppler interrogation of both testes demonstrates low
resistance symmetric blood flow bilaterally.

No inguinal hernia identified.
IMPRESSION: Tiny epididymal cyst versus spermatocele in left epididymis.
Otherwise normal exam.

## 2013-12-23 ENCOUNTER — Encounter (HOSPITAL_COMMUNITY): Payer: Self-pay | Admitting: Emergency Medicine

## 2013-12-23 ENCOUNTER — Emergency Department (INDEPENDENT_AMBULATORY_CARE_PROVIDER_SITE_OTHER)
Admission: EM | Admit: 2013-12-23 | Discharge: 2013-12-23 | Disposition: A | Discharge status: Home / Self Care | Payer: Self-pay | Source: Home / Self Care | Attending: Emergency Medicine | Admitting: Emergency Medicine

## 2013-12-23 ENCOUNTER — Emergency Department (HOSPITAL_COMMUNITY)
Admission: EM | Admit: 2013-12-23 | Discharge: 2013-12-23 | Discharge status: Against Medical Advice | Payer: Self-pay | Attending: Emergency Medicine | Admitting: Emergency Medicine

## 2013-12-23 ENCOUNTER — Emergency Department (INDEPENDENT_AMBULATORY_CARE_PROVIDER_SITE_OTHER): Payer: Self-pay

## 2013-12-23 DIAGNOSIS — N200 Calculus of kidney: Secondary | ICD-10-CM

## 2013-12-23 DIAGNOSIS — R109 Unspecified abdominal pain: Secondary | ICD-10-CM | POA: Insufficient documentation

## 2013-12-23 HISTORY — DX: Calculus of kidney: N20.0

## 2013-12-23 LAB — URINALYSIS, ROUTINE W REFLEX MICROSCOPIC
Bilirubin Urine: NEGATIVE
Glucose, UA: NEGATIVE mg/dL
Ketones, ur: NEGATIVE mg/dL
Leukocytes, UA: NEGATIVE
Nitrite: NEGATIVE
Protein, ur: NEGATIVE mg/dL
Specific Gravity, Urine: 1.024 (ref 1.005–1.030)
Urobilinogen, UA: 0.2 mg/dL (ref 0.0–1.0)
pH: 6 (ref 5.0–8.0)

## 2013-12-23 LAB — CBC WITH DIFFERENTIAL/PLATELET
Basophils Absolute: 0 10*3/uL (ref 0.0–0.1)
Basophils Relative: 0 % (ref 0–1)
Eosinophils Absolute: 0.1 10*3/uL (ref 0.0–0.7)
Eosinophils Relative: 1 % (ref 0–5)
HCT: 35.2 % — ABNORMAL LOW (ref 39.0–52.0)
Hemoglobin: 12 g/dL — ABNORMAL LOW (ref 13.0–17.0)
Lymphocytes Relative: 44 % (ref 12–46)
Lymphs Abs: 2 10*3/uL (ref 0.7–4.0)
MCH: 29.1 pg (ref 26.0–34.0)
MCHC: 34.1 g/dL (ref 30.0–36.0)
MCV: 85.2 fL (ref 78.0–100.0)
Monocytes Absolute: 0.3 10*3/uL (ref 0.1–1.0)
Monocytes Relative: 6 % (ref 3–12)
Neutro Abs: 2.2 10*3/uL (ref 1.7–7.7)
Neutrophils Relative %: 48 % (ref 43–77)
Platelets: 237 10*3/uL (ref 150–400)
RBC: 4.13 MIL/uL — ABNORMAL LOW (ref 4.22–5.81)
RDW: 13.5 % (ref 11.5–15.5)
WBC: 4.6 10*3/uL (ref 4.0–10.5)

## 2013-12-23 LAB — POCT URINALYSIS DIP (DEVICE)
Bilirubin Urine: NEGATIVE
Glucose, UA: NEGATIVE mg/dL
Ketones, ur: NEGATIVE mg/dL
Leukocytes, UA: NEGATIVE
Nitrite: NEGATIVE
Protein, ur: NEGATIVE mg/dL
Specific Gravity, Urine: 1.025 (ref 1.005–1.030)
Urobilinogen, UA: 0.2 mg/dL (ref 0.0–1.0)
pH: 6.5 (ref 5.0–8.0)

## 2013-12-23 LAB — COMPREHENSIVE METABOLIC PANEL
ALT: 19 U/L (ref 0–53)
AST: 14 U/L (ref 0–37)
Albumin: 3.7 g/dL (ref 3.5–5.2)
Alkaline Phosphatase: 74 U/L (ref 39–117)
BUN: 12 mg/dL (ref 6–23)
CO2: 25 mEq/L (ref 19–32)
Calcium: 8.7 mg/dL (ref 8.4–10.5)
Chloride: 104 mEq/L (ref 96–112)
Creatinine, Ser: 1.07 mg/dL (ref 0.50–1.35)
GFR calc Af Amer: >90 mL/min (ref >90)
GFR calc non Af Amer: >90 mL/min (ref >90)
Glucose, Bld: 103 mg/dL — ABNORMAL HIGH (ref 70–99)
Potassium: 3.8 mEq/L (ref 3.7–5.3)
Sodium: 142 mEq/L (ref 137–147)
Total Bilirubin: <0.2 mg/dL — ABNORMAL LOW (ref 0.3–1.2)
Total Protein: 7 g/dL (ref 6.0–8.3)

## 2013-12-23 LAB — URINE MICROSCOPIC-ADD ON

## 2013-12-23 MED ORDER — ONDANSETRON 8 MG PO TBDP: 8.0000 mg | ORAL_TABLET | Freq: Three times a day (TID) | ORAL | Status: DC | PRN

## 2013-12-23 MED ORDER — ONDANSETRON 4 MG PO TBDP
8.0000 mg | ORAL_TABLET | Freq: Once | ORAL | Status: AC
  Administered 2013-12-23: 8 mg via ORAL

## 2013-12-23 MED ORDER — HYDROMORPHONE HCL PF 1 MG/ML IJ SOLN
2.0000 mg | Freq: Once | INTRAMUSCULAR | Status: AC
  Administered 2013-12-23: 2 mg via INTRAMUSCULAR

## 2013-12-23 MED ORDER — HYDROMORPHONE HCL PF 1 MG/ML IJ SOLN
INTRAMUSCULAR | Status: AC
  Filled 2013-12-23: qty 2

## 2013-12-23 MED ORDER — ONDANSETRON 4 MG PO TBDP
ORAL_TABLET | ORAL | Status: AC
  Filled 2013-12-23: qty 2

## 2013-12-23 MED ORDER — HYDROMORPHONE HCL 2 MG PO TABS: 2.0000 mg | ORAL_TABLET | ORAL | Status: DC | PRN

## 2013-12-23 MED ORDER — TAMSULOSIN HCL 0.4 MG PO CAPS: 0.4000 mg | ORAL_CAPSULE | Freq: Every day | ORAL | Status: DC

## 2013-12-23 NOTE — ED Provider Notes (Signed)
Chief Complaint   Chief Complaint  Patient presents with  . Flank Pain    History of Present Illness   Charles Potts is a 28 year old male who developed sudden onset of right flank pain yesterday afternoon. This has radiated around towards the right groin but not in the testicle. The pain is sharp and severe. It makes him feel nauseated but has not vomited. He's had no fever, chills, diarrhea, constipation, or urinary symptoms. No blood in the urine. He has a history of 2 kidney stones in the past one about 3 years ago and 12 years ago. He passed both of these on his own.  Review of Systems   Other than as noted above, the patient denies any of the following symptoms: Constitutional:  No fever, chills, fatigue, weight loss or anorexia. Lungs:  No cough or shortness of breath. Heart:  No chest pain, palpitations, syncope or edema.  No cardiac history. Abdomen:  No nausea, vomiting, hematememesis, melena, diarrhea, or hematochezia. GU:  No dysuria, frequency, urgency, or hematuria.  No testicular pain or swelling.  PMFSH   Past medical history, family history, social history, meds, and allergies were reviewed along with nurse's notes.  No prior abdominal surgeries or history of GI problems.  No use of NSAIDs or aspirin.  No excessive  alcohol intake.   Physical Examination     Vital signs:  BP 149/86  Pulse 80  Temp(Src) 98.5 F (36.9 C) (Oral)  Resp 19  SpO2 100% Gen:  Alert, oriented, in no distress. Lungs:  Breath sounds clear and equal bilaterally.  No wheezes, rales or rhonchi. Heart:  Regular rhythm.  No gallops or murmers.   Abdomen:  Soft, flat, nondistended. No organomegaly or mass. Bowel sounds are normally active. There is no tenderness, guarding, or rebound. He has mild CVA tenderness on the right side. Skin:  Clear, warm and dry.  No rash.  Labs   Results for orders placed during the hospital encounter of 12/23/13  POCT URINALYSIS DIP (DEVICE)      Result Value  Range   Glucose, UA NEGATIVE  NEGATIVE mg/dL   Bilirubin Urine NEGATIVE  NEGATIVE   Ketones, ur NEGATIVE  NEGATIVE mg/dL   Specific Gravity, Urine 1.025  1.005 - 1.030   Hgb urine dipstick LARGE (*) NEGATIVE   pH 6.5  5.0 - 8.0   Protein, ur NEGATIVE  NEGATIVE mg/dL   Urobilinogen, UA 0.2  0.0 - 1.0 mg/dL   Nitrite NEGATIVE  NEGATIVE   Leukocytes, UA NEGATIVE  NEGATIVE     Radiology   Dg Abd 1 View  12/23/2013   CLINICAL DATA:  Abdominal pain  EXAM: ABDOMEN - 1 VIEW  COMPARISON:  None.  FINDINGS: There is moderate stool in the colon. The bowel gas pattern is unremarkable. No obstruction or free air is seen on this supine examination. There are no abnormal calcifications. There are surgical clips in the gallbladder fossa region.  IMPRESSION: Bowel gas pattern unremarkable.  Moderate stool in colon.   Electronically Signed   By: Bretta Bang M.D.   On: 12/23/2013 19:12    Course in Urgent Care Center   He was given Dilaudid 2 mg IM and Zofran ODT 8 mg sublingually. He tolerated both of these well he there was some question of allergy. It does not appear that he is allergic to either Zofran or Dilaudid.  Assessment   The encounter diagnosis was Kidney stone.  Plan     1.  Meds:  The following meds were prescribed:   Discharge Medication List as of 12/23/2013  7:35 PM    START taking these medications   Details  HYDROmorphone (DILAUDID) 2 MG tablet Take 1 tablet (2 mg total) by mouth every 4 (four) hours as needed for severe pain., Starting 12/23/2013, Until Discontinued, Print    ondansetron (ZOFRAN ODT) 8 MG disintegrating tablet Take 1 tablet (8 mg total) by mouth every 8 (eight) hours as needed for nausea., Starting 12/23/2013, Until Discontinued, Normal    tamsulosin (FLOMAX) 0.4 MG CAPS capsule Take 1 capsule (0.4 mg total) by mouth daily., Starting 12/23/2013, Until Discontinued, Normal        2.  Patient Education/Counseling:  The patient was given appropriate handouts,  self care instructions, and instructed in symptomatic relief.  Advised to force fluids, strain all urine, and if no improvement by next week, to followup with Dr. Tama GanderStephen Dahlstadt.  3.  Follow up:  The patient was told to follow up here if no better in 2 to 3 days, or sooner if becoming worse in any way, and given some red flag symptoms such as worsening pain, persistent vomiting, fever, or evidence of GI bleeding which would prompt immediate return.  Follow up with Dr. Tama GanderStephen Dahlstadt next week if no improvement.      Reuben Likesavid C Oakley Orban, MD 12/23/13 2136

## 2013-12-23 NOTE — ED Notes (Signed)
The pt has rt flank pain since yesterday with  Painful urination with n v.  The pain has increased ion the past 3-4 hours

## 2013-12-23 NOTE — Discharge Instructions (Signed)
Kidney Stones  Kidney stones (urolithiasis) are deposits that form inside your kidneys. The intense pain is caused by the stone moving through the urinary tract. When the stone moves, the ureter goes into spasm around the stone. The stone is usually passed in the urine.   CAUSES   · A disorder that makes certain neck glands produce too much parathyroid hormone (primary hyperparathyroidism).  · A buildup of uric acid crystals, similar to gout in your joints.  · Narrowing (stricture) of the ureter.  · A kidney obstruction present at birth (congenital obstruction).  · Previous surgery on the kidney or ureters.  · Numerous kidney infections.  SYMPTOMS   · Feeling sick to your stomach (nauseous).  · Throwing up (vomiting).  · Blood in the urine (hematuria).  · Pain that usually spreads (radiates) to the groin.  · Frequency or urgency of urination.  DIAGNOSIS   · Taking a history and physical exam.  · Blood or urine tests.  · CT scan.  · Occasionally, an examination of the inside of the urinary bladder (cystoscopy) is performed.  TREATMENT   · Observation.  · Increasing your fluid intake.  · Extracorporeal shock wave lithotripsy This is a noninvasive procedure that uses shock waves to break up kidney stones.  · Surgery may be needed if you have severe pain or persistent obstruction. There are various surgical procedures. Most of the procedures are performed with the use of small instruments. Only small incisions are needed to accommodate these instruments, so recovery time is minimized.  The size, location, and chemical composition are all important variables that will determine the proper choice of action for you. Talk to your health care provider to better understand your situation so that you will minimize the risk of injury to yourself and your kidney.   HOME CARE INSTRUCTIONS   · Drink enough water and fluids to keep your urine clear or pale yellow. This will help you to pass the stone or stone fragments.  · Strain  all urine through the provided strainer. Keep all particulate matter and stones for your health care provider to see. The stone causing the pain may be as small as a grain of salt. It is very important to use the strainer each and every time you pass your urine. The collection of your stone will allow your health care provider to analyze it and verify that a stone has actually passed. The stone analysis will often identify what you can do to reduce the incidence of recurrences.  · Only take over-the-counter or prescription medicines for pain, discomfort, or fever as directed by your health care provider.  · Make a follow-up appointment with your health care provider as directed.  · Get follow-up X-rays if required. The absence of pain does not always mean that the stone has passed. It may have only stopped moving. If the urine remains completely obstructed, it can cause loss of kidney function or even complete destruction of the kidney. It is your responsibility to make sure X-rays and follow-ups are completed. Ultrasounds of the kidney can show blockages and the status of the kidney. Ultrasounds are not associated with any radiation and can be performed easily in a matter of minutes.  SEEK MEDICAL CARE IF:  · You experience pain that is progressive and unresponsive to any pain medicine you have been prescribed.  SEEK IMMEDIATE MEDICAL CARE IF:   · Pain cannot be controlled with the prescribed medicine.  · You have a fever   or shaking chills.  · The severity or intensity of pain increases over 18 hours and is not relieved by pain medicine.  · You develop a new onset of abdominal pain.  · You feel faint or pass out.  · You are unable to urinate.  MAKE SURE YOU:   · Understand these instructions.  · Will watch your condition.  · Will get help right away if you are not doing well or get worse.  Document Released: 11/05/2005 Document Revised: 07/08/2013 Document Reviewed: 04/08/2013  [employer]® Patient Information ©2014  [employer].

## 2013-12-23 NOTE — ED Notes (Signed)
Urine strainer given to go

## 2013-12-23 NOTE — ED Notes (Signed)
Per urgent care nurse, pt currently wanting to be seen there.  Will discharge from waiting room.

## 2013-12-23 NOTE — ED Notes (Signed)
C/o R flank pain onset yesterday afternoon.  C/o dysuria onset this AM.  No fever.  Hx. Kidney stones.

## 2014-01-13 ENCOUNTER — Emergency Department (INDEPENDENT_AMBULATORY_CARE_PROVIDER_SITE_OTHER): Payer: Self-pay

## 2014-01-13 ENCOUNTER — Emergency Department (HOSPITAL_COMMUNITY)
Admission: EM | Admit: 2014-01-13 | Discharge: 2014-01-13 | Disposition: A | Discharge status: Home / Self Care | Payer: Self-pay | Attending: Emergency Medicine | Admitting: Emergency Medicine

## 2014-01-13 ENCOUNTER — Encounter (HOSPITAL_COMMUNITY): Payer: Self-pay | Admitting: Emergency Medicine

## 2014-01-13 ENCOUNTER — Emergency Department (INDEPENDENT_AMBULATORY_CARE_PROVIDER_SITE_OTHER)
Admission: EM | Admit: 2014-01-13 | Discharge: 2014-01-13 | Disposition: A | Discharge status: Home / Self Care | Payer: Self-pay | Source: Home / Self Care | Attending: Family Medicine | Admitting: Family Medicine

## 2014-01-13 DIAGNOSIS — Z87442 Personal history of urinary calculi: Secondary | ICD-10-CM | POA: Insufficient documentation

## 2014-01-13 DIAGNOSIS — N200 Calculus of kidney: Secondary | ICD-10-CM

## 2014-01-13 DIAGNOSIS — Z79899 Other long term (current) drug therapy: Secondary | ICD-10-CM | POA: Insufficient documentation

## 2014-01-13 DIAGNOSIS — R110 Nausea: Secondary | ICD-10-CM | POA: Insufficient documentation

## 2014-01-13 DIAGNOSIS — R319 Hematuria, unspecified: Secondary | ICD-10-CM | POA: Insufficient documentation

## 2014-01-13 HISTORY — DX: Calculus of kidney: N20.0

## 2014-01-13 LAB — URINALYSIS, ROUTINE W REFLEX MICROSCOPIC
Bilirubin Urine: NEGATIVE
GLUCOSE, UA: NEGATIVE mg/dL
Ketones, ur: NEGATIVE mg/dL
LEUKOCYTES UA: NEGATIVE
NITRITE: NEGATIVE
Protein, ur: NEGATIVE mg/dL
SPECIFIC GRAVITY, URINE: 1.029 (ref 1.005–1.030)
Urobilinogen, UA: 0.2 mg/dL (ref 0.0–1.0)
pH: 6.5 (ref 5.0–8.0)

## 2014-01-13 LAB — POCT URINALYSIS DIP (DEVICE)
Bilirubin Urine: NEGATIVE
Glucose, UA: NEGATIVE mg/dL
Ketones, ur: NEGATIVE mg/dL
Leukocytes, UA: NEGATIVE
Nitrite: NEGATIVE
Protein, ur: NEGATIVE mg/dL
Specific Gravity, Urine: 1.01 (ref 1.005–1.030)
Urobilinogen, UA: 0.2 mg/dL (ref 0.0–1.0)
pH: 6.5 (ref 5.0–8.0)

## 2014-01-13 LAB — I-STAT CHEM 8, ED
BUN: 13 mg/dL (ref 6–23)
CHLORIDE: 101 meq/L (ref 96–112)
Calcium, Ion: 1.18 mmol/L (ref 1.12–1.23)
Creatinine, Ser: 1.2 mg/dL (ref 0.50–1.35)
Glucose, Bld: 106 mg/dL — ABNORMAL HIGH (ref 70–99)
HEMATOCRIT: 50 % (ref 39.0–52.0)
Hemoglobin: 17 g/dL (ref 13.0–17.0)
POTASSIUM: 3.9 meq/L (ref 3.7–5.3)
Sodium: 141 mEq/L (ref 137–147)
TCO2: 26 mmol/L (ref 0–100)

## 2014-01-13 LAB — URINE MICROSCOPIC-ADD ON

## 2014-01-13 MED ORDER — ONDANSETRON 4 MG PO TBDP: 4.0000 mg | ORAL_TABLET | Freq: Three times a day (TID) | ORAL | Status: DC | PRN

## 2014-01-13 MED ORDER — HYDROMORPHONE HCL PF 1 MG/ML IJ SOLN
INTRAMUSCULAR | Status: AC
  Filled 2014-01-13: qty 2

## 2014-01-13 MED ORDER — PROMETHAZINE HCL 25 MG PO TABS: 25.0000 mg | ORAL_TABLET | Freq: Four times a day (QID) | ORAL | Status: DC | PRN

## 2014-01-13 MED ORDER — OXYCODONE-ACETAMINOPHEN 5-325 MG PO TABS: 2.0000 | ORAL_TABLET | ORAL | Status: DC | PRN

## 2014-01-13 MED ORDER — HYDROMORPHONE HCL PF 1 MG/ML IJ SOLN
1.0000 mg | Freq: Once | INTRAMUSCULAR | Status: AC
  Administered 2014-01-13: 1 mg via INTRAVENOUS
  Filled 2014-01-13: qty 1

## 2014-01-13 MED ORDER — ONDANSETRON HCL 4 MG/2ML IJ SOLN
INTRAMUSCULAR | Status: AC
  Filled 2014-01-13: qty 2

## 2014-01-13 MED ORDER — HYDROMORPHONE HCL 1 MG/ML IJ SOLN
2.0000 mg | Freq: Once | INTRAMUSCULAR | Status: AC
  Administered 2014-01-13: 2 mg via INTRAMUSCULAR

## 2014-01-13 MED ORDER — PROMETHAZINE HCL 25 MG/ML IJ SOLN
25.0000 mg | Freq: Once | INTRAMUSCULAR | Status: AC
  Administered 2014-01-13: 25 mg via INTRAVENOUS
  Filled 2014-01-13: qty 1

## 2014-01-13 MED ORDER — ONDANSETRON HCL 4 MG/2ML IJ SOLN
4.0000 mg | Freq: Once | INTRAMUSCULAR | Status: AC
  Administered 2014-01-13: 4 mg via INTRAMUSCULAR

## 2014-01-13 MED ORDER — TAMSULOSIN HCL 0.4 MG PO CAPS: 0.4000 mg | ORAL_CAPSULE | Freq: Once | ORAL | Status: DC

## 2014-01-13 NOTE — ED Provider Notes (Signed)
CSN: [id number]     Arrival date & time 01/13/14  [id number] History   First MD Initiated Contact with Patient 01/13/14 1005     No chief complaint on file.    (Consider location/radiation/quality/duration/timing/severity/associated sxs/prior Treatment) Patient is a 28 y.o. male presenting with back pain. The history is provided by the patient. No language interpreter was used.  Back Pain Location:  Generalized Quality:  Aching Radiates to:  Does not radiate Pain severity:  Moderate Onset quality:  Sudden Duration:  1 day Timing:  Constant Progression:  Worsening Relieved by:  Nothing Worsened by:  Nothing tried Ineffective treatments:  None tried Associated symptoms: abdominal pain     No past medical history on file. No past surgical history on file. No family history on file. History  Substance Use Topics  . Smoking status: Not on file  . Smokeless tobacco: Not on file  . Alcohol Use: Not on file    Review of Systems  Gastrointestinal: Positive for abdominal pain.  Musculoskeletal: Positive for back pain.  All other systems reviewed and are negative.      Allergies  Review of patient's allergies indicates not on file.  Home Medications  No current outpatient prescriptions on file. There were no vitals taken for this visit. Physical Exam  Nursing note and vitals reviewed. Constitutional: He is oriented to person, place, and time. He appears well-developed and well-nourished.  HENT:  Head: Normocephalic.  Eyes: Conjunctivae and EOM are normal. Pupils are equal, round, and reactive to light.  Neck: Normal range of motion. Neck supple.  Pulmonary/Chest: Effort normal.  Abdominal: Soft. He exhibits no distension.  Tender right flank  Musculoskeletal: Normal range of motion.  Neurological: He is alert and oriented to person, place, and time.  Psychiatric: He has a normal mood and affect.    ED Course  Procedures (including critical care time) Labs Review Labs  Reviewed  URINALYSIS, DIPSTICK ONLY   Imaging Review No results found.    MDM   Final diagnoses:  None    Pt given dilaudid and zofran.   Rx for flomax and percocet with zofran for nasea.   Pt advised to follow up with urology.  Pt to go to ED if symptoms worsen   Elson AreasLeslie K Sofia, [place]PA-C 01/13/14 1102

## 2014-01-13 NOTE — ED Notes (Signed)
Pt c/o right sided flank pain and dysuria X 1 day. Pt reports he had the same problem about 3 weeks ago and dx with a kidney stone. Pt reports he has been drinking a lot of water since then. Nad, skin warm and dry, resp e/u.

## 2014-01-13 NOTE — ED Notes (Signed)
Pt  Reports  He  Has  Had  Dilaudid in past  With  No  Adverse  Reactions

## 2014-01-13 NOTE — ED Notes (Signed)
Pt  Has  A  History  Of  Kidney   Stone      Pt reports     Symptoms    Started   Yesterday   denys  Any   specefic      Injury

## 2014-01-13 NOTE — ED Provider Notes (Signed)
Medical screening examination/treatment/procedure(s) were performed by non-physician practitioner and as supervising physician I was immediately available for consultation/collaboration.  EKG Interpretation   None        Jermaine Tholl T Kerryn Tennant, MD 01/13/14 1527 

## 2014-01-13 NOTE — ED Provider Notes (Signed)
CSN: [id number]     Arrival date & time 01/13/14  7829 History   First MD Initiated Contact with Patient 01/13/14 [phone]     Chief Complaint  Patient presents with  . Flank Pain  . Dysuria     (Consider location/radiation/quality/duration/timing/severity/associated sxs/prior Treatment) HPI Comments: Patient is a 28 year old male with history of a kidney stones who presents today with right-sided flank pain since yesterday. The pain has gradually worsened and is sharp in nature. It begins in his back and radiates around to his side. He has associated dysuria. This feels like his prior kidney stones. He has been seen previously by a urologist in [facility] He has always been able to pass these stones on his own. His last stone was 2mm. He reports he has had approximately 7-10 CT scans in his life. He denies urinary urgency, frequency. He denies any difficulty with urination. He denies fever, chills, vomiting.   The history is provided by the patient. No language interpreter was used.    Past Medical History  Diagnosis Date  . Kidney stone    Past Surgical History  Procedure Laterality Date  . Appendectomy    . Cholecystectomy     No family history on file. History  Substance Use Topics  . Smoking status: Never Smoker   . Smokeless tobacco: Not on file  . Alcohol Use: No    Review of Systems  Constitutional: Negative for fever and chills.  Respiratory: Negative for shortness of breath.   Gastrointestinal: Positive for nausea. Negative for vomiting.  Genitourinary: Positive for dysuria and flank pain. Negative for urgency, hematuria and difficulty urinating.  All other systems reviewed and are negative.      Allergies  Demerol; Morphine and related; Ultram; Zofran; and Toradol  Home Medications   Current Outpatient Rx  Name  Route  Sig  Dispense  Refill  . HYDROmorphone (DILAUDID) 2 MG tablet   Oral   Take 1 tablet (2 mg total) by mouth every 4 (four) hours as  needed for severe pain.   20 tablet   0   . ondansetron (ZOFRAN ODT) 8 MG disintegrating tablet   Oral   Take 1 tablet (8 mg total) by mouth every 8 (eight) hours as needed for nausea.   20 tablet   0   . tamsulosin (FLOMAX) 0.4 MG CAPS capsule   Oral   Take 1 capsule (0.4 mg total) by mouth daily.   7 capsule   0    BP 149/94  Pulse 71  Temp(Src) 98.2 F (36.8 C) (Oral)  Resp 18  SpO2 95% Physical Exam  Nursing note and vitals reviewed. Constitutional: He is oriented to person, place, and time. He appears well-developed and well-nourished. No distress.  Very well appearing  HENT:  Head: Normocephalic and atraumatic.  Right Ear: External ear normal.  Left Ear: External ear normal.  Nose: Nose normal.  Eyes: Conjunctivae are normal.  Neck: Normal range of motion. No tracheal deviation present.  Cardiovascular: Normal rate, regular rhythm and normal heart sounds.   Pulmonary/Chest: Effort normal and breath sounds normal. No stridor.  Abdominal: Soft. He exhibits no distension. There is no tenderness. There is no rigidity, no rebound, no guarding and no CVA tenderness.    Musculoskeletal: Normal range of motion.       Back:  Neurological: He is alert and oriented to person, place, and time.  Skin: Skin is warm and dry. He is not diaphoretic.  Psychiatric: He has  a normal mood and affect. His behavior is normal.    ED Course  Procedures (including critical care time) Labs Review Labs Reviewed  URINALYSIS, ROUTINE W REFLEX MICROSCOPIC - Abnormal; Notable for the following:    Hgb urine dipstick LARGE (*)    All other components within normal limits  I-STAT CHEM 8, ED - Abnormal; Notable for the following:    Glucose, Bld 106 (*)    All other components within normal limits  URINE CULTURE  URINE MICROSCOPIC-ADD ON   Imaging Review No results found.  EKG Interpretation   None       MDM   Final diagnoses:  Hematuria   Patient presents to the emergency  department today with right flank pain. This is the same pain as his normal kidney stones. His urine is not infected and his creatinine is normal. 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 Pain is well-controlled with 1 mg of Dilaudid and Phenergan in the emergency department. I do not feel like patient needs imaging at this time. He has been able to pass all prior kidney stones. Strict return instructions given. He will followup with his urologist. Vital signs stable for discharge. Discussed case with Dr. Freida BusmanAllen who agrees with plan. Patient / Family / Caregiver informed of clinical course, understand medical decision-making process, and agree with plan.    Mora BellmanHannah S Floraine Buechler, PA-C 01/13/14 [phone]

## 2014-01-13 NOTE — Discharge Instructions (Signed)
Kidney Stones  Kidney stones (urolithiasis) are deposits that form inside your kidneys. The intense pain is caused by the stone moving through the urinary tract. When the stone moves, the ureter goes into spasm around the stone. The stone is usually passed in the urine.   CAUSES   · A disorder that makes certain neck glands produce too much parathyroid hormone (primary hyperparathyroidism).  · A buildup of uric acid crystals, similar to gout in your joints.  · Narrowing (stricture) of the ureter.  · A kidney obstruction present at birth (congenital obstruction).  · Previous surgery on the kidney or ureters.  · Numerous kidney infections.  SYMPTOMS   · Feeling sick to your stomach (nauseous).  · Throwing up (vomiting).  · Blood in the urine (hematuria).  · Pain that usually spreads (radiates) to the groin.  · Frequency or urgency of urination.  DIAGNOSIS   · Taking a history and physical exam.  · Blood or urine tests.  · CT scan.  · Occasionally, an examination of the inside of the urinary bladder (cystoscopy) is performed.  TREATMENT   · Observation.  · Increasing your fluid intake.  · Extracorporeal shock wave lithotripsy This is a noninvasive procedure that uses shock waves to break up kidney stones.  · Surgery may be needed if you have severe pain or persistent obstruction. There are various surgical procedures. Most of the procedures are performed with the use of small instruments. Only small incisions are needed to accommodate these instruments, so recovery time is minimized.  The size, location, and chemical composition are all important variables that will determine the proper choice of action for you. Talk to your health care provider to better understand your situation so that you will minimize the risk of injury to yourself and your kidney.   HOME CARE INSTRUCTIONS   · Drink enough water and fluids to keep your urine clear or pale yellow. This will help you to pass the stone or stone fragments.  · Strain  all urine through the provided strainer. Keep all particulate matter and stones for your health care provider to see. The stone causing the pain may be as small as a grain of salt. It is very important to use the strainer each and every time you pass your urine. The collection of your stone will allow your health care provider to analyze it and verify that a stone has actually passed. The stone analysis will often identify what you can do to reduce the incidence of recurrences.  · Only take over-the-counter or prescription medicines for pain, discomfort, or fever as directed by your health care provider.  · Make a follow-up appointment with your health care provider as directed.  · Get follow-up X-rays if required. The absence of pain does not always mean that the stone has passed. It may have only stopped moving. If the urine remains completely obstructed, it can cause loss of kidney function or even complete destruction of the kidney. It is your responsibility to make sure X-rays and follow-ups are completed. Ultrasounds of the kidney can show blockages and the status of the kidney. Ultrasounds are not associated with any radiation and can be performed easily in a matter of minutes.  SEEK MEDICAL CARE IF:  · You experience pain that is progressive and unresponsive to any pain medicine you have been prescribed.  SEEK IMMEDIATE MEDICAL CARE IF:   · Pain cannot be controlled with the prescribed medicine.  · You have a fever   or shaking chills.  · The severity or intensity of pain increases over 18 hours and is not relieved by pain medicine.  · You develop a new onset of abdominal pain.  · You feel faint or pass out.  · You are unable to urinate.  MAKE SURE YOU:   · Understand these instructions.  · Will watch your condition.  · Will get help right away if you are not doing well or get worse.  Document Released: 11/05/2005 Document Revised: 07/08/2013 Document Reviewed: 04/08/2013  [employer]® Patient Information ©2014  [employer].

## 2014-01-14 LAB — URINE CULTURE

## 2014-01-15 NOTE — ED Provider Notes (Signed)
Medical screening examination/treatment/procedure(s) were performed by a resident physician or non-physician practitioner and as the supervising physician I was immediately available for consultation/collaboration.  Clementeen GrahamEvan Silvia Hightower, MD    Rodolph BongEvan S Shaleigh Laubscher, MD 01/15/14 [phone]

## 2014-01-21 ENCOUNTER — Encounter (HOSPITAL_COMMUNITY): Payer: Self-pay | Admitting: Emergency Medicine

## 2014-01-21 ENCOUNTER — Emergency Department (INDEPENDENT_AMBULATORY_CARE_PROVIDER_SITE_OTHER)
Admission: EM | Admit: 2014-01-21 | Discharge: 2014-01-21 | Disposition: A | Discharge status: Home / Self Care | Payer: Self-pay | Source: Home / Self Care | Attending: Emergency Medicine | Admitting: Emergency Medicine

## 2014-01-21 ENCOUNTER — Emergency Department (INDEPENDENT_AMBULATORY_CARE_PROVIDER_SITE_OTHER): Payer: Self-pay

## 2014-01-21 DIAGNOSIS — R319 Hematuria, unspecified: Secondary | ICD-10-CM

## 2014-01-21 DIAGNOSIS — Z87442 Personal history of urinary calculi: Secondary | ICD-10-CM

## 2014-01-21 DIAGNOSIS — R109 Unspecified abdominal pain: Secondary | ICD-10-CM

## 2014-01-21 LAB — CBC WITH DIFFERENTIAL/PLATELET
BASOS PCT: 0 % (ref 0–1)
Basophils Absolute: 0 10*3/uL (ref 0.0–0.1)
EOS ABS: 0 10*3/uL (ref 0.0–0.7)
EOS PCT: 1 % (ref 0–5)
HEMATOCRIT: 44.8 % (ref 39.0–52.0)
HEMOGLOBIN: 15.7 g/dL (ref 13.0–17.0)
LYMPHS ABS: 2.1 10*3/uL (ref 0.7–4.0)
Lymphocytes Relative: 39 % (ref 12–46)
MCH: 30.3 pg (ref 26.0–34.0)
MCHC: 35 g/dL (ref 30.0–36.0)
MCV: 86.5 fL (ref 78.0–100.0)
MONO ABS: 0.3 10*3/uL (ref 0.1–1.0)
Monocytes Relative: 6 % (ref 3–12)
Neutro Abs: 2.9 10*3/uL (ref 1.7–7.7)
Neutrophils Relative %: 55 % (ref 43–77)
Platelets: 277 10*3/uL (ref 150–400)
RBC: 5.18 MIL/uL (ref 4.22–5.81)
RDW: 13.2 % (ref 11.5–15.5)
WBC: 5.3 10*3/uL (ref 4.0–10.5)

## 2014-01-21 LAB — POCT URINALYSIS DIP (DEVICE)
GLUCOSE, UA: NEGATIVE mg/dL
Ketones, ur: NEGATIVE mg/dL
Leukocytes, UA: NEGATIVE
NITRITE: NEGATIVE
PH: 6.5 (ref 5.0–8.0)
Protein, ur: 30 mg/dL — AB
SPECIFIC GRAVITY, URINE: 1.025 (ref 1.005–1.030)
Urobilinogen, UA: 0.2 mg/dL (ref 0.0–1.0)

## 2014-01-21 LAB — POCT I-STAT, CHEM 8
BUN: 14 mg/dL (ref 6–23)
CHLORIDE: 101 meq/L (ref 96–112)
Calcium, Ion: 1.18 mmol/L (ref 1.12–1.23)
Creatinine, Ser: 1.1 mg/dL (ref 0.50–1.35)
Glucose, Bld: 100 mg/dL — ABNORMAL HIGH (ref 70–99)
HEMATOCRIT: 51 % (ref 39.0–52.0)
Hemoglobin: 17.3 g/dL — ABNORMAL HIGH (ref 13.0–17.0)
Potassium: 4.4 mEq/L (ref 3.7–5.3)
SODIUM: 142 meq/L (ref 137–147)
TCO2: 27 mmol/L (ref 0–100)

## 2014-01-21 MED ORDER — HYDROCODONE-ACETAMINOPHEN 5-325 MG PO TABS: 1.0000 | ORAL_TABLET | Freq: Four times a day (QID) | ORAL | Status: DC | PRN

## 2014-01-21 MED ORDER — IBUPROFEN 800 MG PO TABS: 800.0000 mg | ORAL_TABLET | Freq: Three times a day (TID) | ORAL | Status: DC

## 2014-01-21 MED ORDER — TAMSULOSIN HCL 0.4 MG PO CAPS: 0.4000 mg | ORAL_CAPSULE | Freq: Every day | ORAL | Status: DC

## 2014-01-21 NOTE — ED Notes (Signed)
Discharged  With  Three  rx  Stapled  To  Discharge  Papers      And  Urinary  Strainer

## 2014-01-21 NOTE — Discharge Instructions (Signed)

## 2014-01-21 NOTE — ED Provider Notes (Signed)
CSN: [id number]     Arrival date & time 01/21/14  1451 History   First MD Initiated Contact with Patient 01/21/14 1600     Chief Complaint  Patient presents with  . Back Pain   (Consider location/radiation/quality/duration/timing/severity/associated sxs/prior Treatment) HPI Comments: 28 year old male presents complaining of right flank pain. This started today. He has intermittent bouts of right flank pain that are brought on by trying to urinate. The pain will stay for about an hour and a half before it starts to fade. He has a history of a kidney stone on the left side but never on the right. He has not tried taking anything for the pain. He denies any injury. He denies gross hematuria. No fever, chills, NVD, abdominal pain. No penile discharge or burning with urination.  Patient is a 28 y.o. male presenting with back pain.  Back Pain Associated symptoms: no abdominal pain, no chest pain, no dysuria, no fever and no weakness     Past Medical History  Diagnosis Date  . Kidney calculi    History reviewed. No pertinent past surgical history. History reviewed. No pertinent family history. History  Substance Use Topics  . Smoking status: Not on file  . Smokeless tobacco: Not on file  . Alcohol Use: Not on file    Review of Systems  Constitutional: Negative for fever, chills and fatigue.  HENT: Negative for sore throat.   Eyes: Negative for visual disturbance.  Respiratory: Negative for cough and shortness of breath.   Cardiovascular: Negative for chest pain, palpitations and leg swelling.  Gastrointestinal: Negative for nausea, vomiting, abdominal pain, diarrhea and constipation.  Genitourinary: Positive for flank pain. Negative for dysuria, urgency, frequency and hematuria.  Musculoskeletal: Positive for back pain. Negative for arthralgias, myalgias, neck pain and neck stiffness.  Skin: Negative for rash.  Neurological: Negative for dizziness, weakness and light-headedness.     Allergies  Demerol; Morphine and related; and Toradol  Home Medications   Current Outpatient Rx  Name  Route  Sig  Dispense  Refill  . HYDROcodone-acetaminophen (NORCO) 5-325 MG per tablet   Oral   Take 1 tablet by mouth every 6 (six) hours as needed for moderate pain.   15 tablet   0   . ibuprofen (ADVIL,MOTRIN) 800 MG tablet   Oral   Take 1 tablet (800 mg total) by mouth 3 (three) times daily.   60 tablet   0   . tamsulosin (FLOMAX) 0.4 MG CAPS capsule   Oral   Take 1 capsule (0.4 mg total) by mouth daily.   15 capsule   0    BP 124/78  Pulse 78  Temp(Src) 98.6 F (37 C) (Oral)  Resp 18  SpO2 100% Physical Exam  Nursing note and vitals reviewed. Constitutional: He is oriented to person, place, and time. He appears well-developed and well-nourished. No distress.  HENT:  Head: Normocephalic.  Cardiovascular: Normal rate, regular rhythm and normal heart sounds.  Exam reveals no gallop and no friction rub.   No murmur heard. Pulmonary/Chest: Effort normal and breath sounds normal. No respiratory distress. He has no wheezes. He has no rales.  Abdominal: Soft. He exhibits no mass. There is no tenderness. There is CVA tenderness (right). There is no rebound and no guarding.  Musculoskeletal:       Lumbar back: Normal.  Neurological: He is alert and oriented to person, place, and time. Coordination normal.  Skin: Skin is warm and dry. No rash noted. He is not diaphoretic.  Psychiatric: He has a normal mood and affect. Judgment normal.    ED Course  Procedures (including critical care time) Labs Review Labs Reviewed  POCT URINALYSIS DIP (DEVICE) - Abnormal; Notable for the following:    Bilirubin Urine SMALL (*)    Hgb urine dipstick LARGE (*)    Protein, ur 30 (*)    All other components within normal limits  POCT I-STAT, CHEM 8 - Abnormal; Notable for the following:    Glucose, Bld 100 (*)    Hemoglobin 17.3 (*)    All other components within normal limits   URINE CULTURE  CBC WITH DIFFERENTIAL   Imaging Review Dg Abd 1 View  01/21/2014   CLINICAL DATA:  Right lower abdominal pain, history of nephrolithiasis  EXAM: ABDOMEN - 1 VIEW  COMPARISON:  None.  FINDINGS: The bowel gas pattern is normal. No radio-opaque calculi or other significant radiographic abnormality are seen. Prior cholecystectomy noted.  IMPRESSION: No acute finding.   Electronically Signed   By: Ruel Favors M.D.   On: 01/21/2014 16:51     MDM   1. Flank pain   2. Hematuria   3. History of kidney stones    istat and CBC normal.  UA shows hematuria, proteinuria.  No stone seen on KUB.  Story isnt great for kidney stones but he has a Hx and flank pain/CVA tenderness with otherwise normal workup so probably kidney stone.  Will treat as such, he will f/u with urology.  Urine strainer provided.     Meds ordered this encounter  Medications  . DISCONTD: HYDROcodone-acetaminophen (NORCO) 5-325 MG per tablet    Sig: Take 1 tablet by mouth every 6 (six) hours as needed for moderate pain.    Dispense:  30 tablet    Refill:  0    Order Specific Question:  Supervising Provider    Answer:  Clementeen Graham, S [id number]  . DISCONTD: tamsulosin (FLOMAX) 0.4 MG CAPS capsule    Sig: Take 1 capsule (0.4 mg total) by mouth daily.    Dispense:  15 capsule    Refill:  0    Order Specific Question:  Supervising Provider    Answer:  Clementeen Graham, S [id number]  . DISCONTD: ibuprofen (ADVIL,MOTRIN) 800 MG tablet    Sig: Take 1 tablet (800 mg total) by mouth 3 (three) times daily.    Dispense:  60 tablet    Refill:  0    Order Specific Question:  Supervising Provider    Answer:  Clementeen Graham, S [id number]  . tamsulosin (FLOMAX) 0.4 MG CAPS capsule    Sig: Take 1 capsule (0.4 mg total) by mouth daily.    Dispense:  15 capsule    Refill:  0    Order Specific Question:  Supervising Provider    Answer:  Clementeen Graham, S [id number]  . ibuprofen (ADVIL,MOTRIN) 800 MG tablet    Sig: Take 1 tablet (800 mg total) by  mouth 3 (three) times daily.    Dispense:  60 tablet    Refill:  0    Order Specific Question:  Supervising Provider    Answer:  Clementeen Graham, S [id number]  . HYDROcodone-acetaminophen (NORCO) 5-325 MG per tablet    Sig: Take 1 tablet by mouth every 6 (six) hours as needed for moderate pain.    Dispense:  15 tablet    Refill:  0    Order Specific Question:  Supervising Provider    Answer:  Clementeen Graham, Kathie Rhodes [phone]  Graylon GoodZachary H Garris Melhorn, PA-C 01/21/14 [phone]

## 2014-01-21 NOTE — ED Provider Notes (Signed)
Medical screening examination/treatment/procedure(s) were performed by non-physician practitioner and as supervising physician I was immediately available for consultation/collaboration.  Leslee Homeavid Leidy Massar, M.D.  Reuben Likesavid C Janica Eldred, MD 01/21/14 2207

## 2014-01-21 NOTE — ED Notes (Signed)
Pt  Reports       r  Lower         Side  Of  Back   X     1  Day  Ago also  Reports  Some  Pain  When he  Urinates      denys  Any  specefic  Injury

## 2014-01-22 LAB — URINE CULTURE
COLONY COUNT: NO GROWTH
Culture: NO GROWTH

## 2014-02-25 ENCOUNTER — Encounter (HOSPITAL_COMMUNITY): Payer: Self-pay | Admitting: Emergency Medicine

## 2014-02-25 ENCOUNTER — Encounter: Payer: Self-pay | Admitting: *Deleted

## 2023-03-05 ENCOUNTER — Other Ambulatory Visit: Payer: Self-pay

## 2023-03-05 ENCOUNTER — Emergency Department (HOSPITAL_COMMUNITY)
Admission: EM | Admit: 2023-03-05 | Discharge: 2023-03-05 | Disposition: A | Discharge status: Home / Self Care | Attending: Emergency Medicine | Admitting: Emergency Medicine

## 2023-03-05 DIAGNOSIS — I493 Ventricular premature depolarization: Secondary | ICD-10-CM | POA: Diagnosis not present

## 2023-03-05 DIAGNOSIS — R42 Dizziness and giddiness: Secondary | ICD-10-CM | POA: Diagnosis present

## 2023-03-05 LAB — COMPREHENSIVE METABOLIC PANEL
ALT: 23 U/L (ref 0–44)
AST: 20 U/L (ref 15–41)
Albumin: 4.3 g/dL (ref 3.5–5.0)
Alkaline Phosphatase: 72 U/L (ref 38–126)
Anion gap: 12 (ref 5–15)
BUN: 14 mg/dL (ref 6–20)
CO2: 24 mmol/L (ref 22–32)
Calcium: 9.1 mg/dL (ref 8.9–10.3)
Chloride: 101 mmol/L (ref 98–111)
Creatinine, Ser: 1.08 mg/dL (ref 0.61–1.24)
GFR, Estimated: >60 mL/min (ref >60)
Glucose, Bld: 104 mg/dL — ABNORMAL HIGH (ref 70–99)
Potassium: 4.3 mmol/L (ref 3.5–5.1)
Sodium: 137 mmol/L (ref 135–145)
Total Bilirubin: 0.8 mg/dL (ref 0.3–1.2)
Total Protein: 8 g/dL (ref 6.5–8.1)

## 2023-03-05 LAB — CBC WITH DIFFERENTIAL/PLATELET
Abs Immature Granulocytes: 0.01 10*3/uL (ref 0.00–0.07)
Basophils Absolute: 0 10*3/uL (ref 0.0–0.1)
Basophils Relative: 0 %
Eosinophils Absolute: 0 10*3/uL (ref 0.0–0.5)
Eosinophils Relative: 1 %
HCT: 49 % (ref 39.0–52.0)
Hemoglobin: 16.3 g/dL (ref 13.0–17.0)
Immature Granulocytes: 0 %
Lymphocytes Relative: 31 %
Lymphs Abs: 1.8 10*3/uL (ref 0.7–4.0)
MCH: 29.5 pg (ref 26.0–34.0)
MCHC: 33.3 g/dL (ref 30.0–36.0)
MCV: 88.8 fL (ref 80.0–100.0)
Monocytes Absolute: 0.6 10*3/uL (ref 0.1–1.0)
Monocytes Relative: 10 %
Neutro Abs: 3.4 10*3/uL (ref 1.7–7.7)
Neutrophils Relative %: 58 %
Platelets: 322 10*3/uL (ref 150–400)
RBC: 5.52 MIL/uL (ref 4.22–5.81)
RDW: 12.2 % (ref 11.5–15.5)
WBC: 5.9 10*3/uL (ref 4.0–10.5)
nRBC: 0 % (ref 0.0–0.2)

## 2023-03-05 LAB — MAGNESIUM: Magnesium: 2.3 mg/dL (ref 1.7–2.4)

## 2023-03-05 LAB — TROPONIN I (HIGH SENSITIVITY): Troponin I (High Sensitivity): 8 ng/L (ref <18)

## 2023-03-05 MED ORDER — SODIUM CHLORIDE 0.9 % IV BOLUS
1000.0000 mL | Freq: Once | INTRAVENOUS | Status: AC
  Administered 2023-03-05: 1000 mL via INTRAVENOUS

## 2023-03-05 NOTE — ED Provider Notes (Signed)
Patient signed out to me at 1500 by Dr. Adela Lank pending labs and reassessment.  In short this is a 37 year old male presenting to the emergency department in police custody with dizziness that started earlier this morning.  Patient's EKG on arrival showed sinus rhythm with multiple PVCs but otherwise without acute ischemic changes.  Patient's labs are within normal range without electrolyte abnormality to explain his PVCs.  He has remained hemodynamically stable here in the emergency department and has received 1 L of IV fluids for report of low blood pressure at the present today.  Evaluation, the patient is well-appearing and asymptomatic in no acute distress.  His heart is regular rate and irregular rhythm, consistent with the PVCs seen on EKG and telemetry.  Patient's stable for discharge back to police custody with outpatient follow-up.   Rexford Maus, DO 03/05/23 [phone]

## 2023-03-05 NOTE — ED Triage Notes (Addendum)
C/o dizziness this am but has resolved now. [facility] and was referred to ED for hypotension and abnormal EKG.  Patient ambulatory to triage  Denies CP and SOB

## 2023-03-05 NOTE — Discharge Instructions (Signed)
You were seen in the emergency department for your dizziness and heart palpitations.  You did have PVCs on your EKG which is an extra beat that can make you feel palpitations but is generally benign.  Your electrolytes and heart enzyme were normal with no signs of severe dehydration or heart attack to cause your symptoms.  You can follow-up with your primary doctor to have your symptoms rechecked.  You should return to the emergency department for severe chest pain, severe shortness of breath, if you pass out or if you have any other new or concerning symptoms.

## 2023-03-05 NOTE — ED Provider Notes (Signed)
[place] EMERGENCY DEPARTMENT AT [facility] Provider Note   CSN: [id number] Arrival date & time: 03/05/23  1328     History  Chief Complaint  Patient presents with   Dizziness    Charles Potts is a 37 y.o. male.  37 yo M With a chief complaints of dizziness.  He said that this morning he felt a little bit unwell and lasted for about an hour or so and then went away.  There is some concern that his blood pressure was low [facility] and he ended up having an EKG performed that they also were concerned about and sent him here for evaluation.  He denies any other symptoms other than not feeling well for a short period of time this morning.  Denies any cough congestion denies abdominal pain denies chest pain denies difficulty breathing has a history of heart murmur.  Denies any chronic medications.   Dizziness      Home Medications Prior to Admission medications   Medication Sig Start Date End Date Taking? Authorizing Provider  dicyclomine (BENTYL) 20 MG tablet Take 1 tablet (20 mg total) by mouth 2 (two) times daily. 03/05/12 03/05/13  Chilton Si, PA-C  HYDROcodone-acetaminophen (NORCO) 5-325 MG per tablet Take 1 tablet by mouth every 6 (six) hours as needed for moderate pain. 01/21/14   Excell Seltzer, Adrian Blackwater, PA-C  ibuprofen (ADVIL,MOTRIN) 800 MG tablet Take 1 tablet (800 mg total) by mouth 3 (three) times daily. 01/21/14   Excell Seltzer, Adrian Blackwater, PA-C  ondansetron (ZOFRAN ODT) 4 MG disintegrating tablet Take 1 tablet (4 mg total) by mouth every 8 (eight) hours as needed for nausea or vomiting. 01/13/14   Elson Areas, PA-C  oxyCODONE-acetaminophen (PERCOCET/ROXICET) 5-325 MG per tablet Take 2 tablets by mouth every 4 (four) hours as needed for severe pain. 01/13/14   Junious Silk, PA-C  oxyCODONE-acetaminophen (PERCOCET/ROXICET) 5-325 MG per tablet Take 2 tablets by mouth every 4 (four) hours as needed for severe pain. 01/13/14   Elson Areas, PA-C   promethazine (PHENERGAN) 25 MG tablet Take 1 tablet (25 mg total) by mouth every 6 (six) hours as needed for nausea. 02/13/12 02/20/12  Ivery Quale, PA-C  promethazine (PHENERGAN) 25 MG tablet Take 1 tablet (25 mg total) by mouth every 6 (six) hours as needed for nausea or vomiting. 01/13/14   Junious Silk, PA-C  tamsulosin (FLOMAX) 0.4 MG CAPS capsule Take 1 capsule (0.4 mg total) by mouth once. 01/13/14   Elson Areas, PA-C  tamsulosin (FLOMAX) 0.4 MG CAPS capsule Take 1 capsule (0.4 mg total) by mouth daily. 01/21/14   Graylon Good, PA-C  Tamsulosin HCl (FLOMAX) 0.4 MG CAPS Take 1 capsule (0.4 mg total) by mouth daily after supper. 07/30/12   Floydene Flock, MD      Allergies    Shrimp [shellfish allergy], Demerol [meperidine hcl], Demerol [meperidine], Morphine and related, Morphine and related, Toradol [ketorolac tromethamine], Toradol [ketorolac tromethamine], Ultram [tramadol], Ultram [tramadol], Zofran, Zofran [ondansetron hcl], Meperidine, Morphine, Morphine and related, Toradol [ketorolac tromethamine], and Ultram [tramadol]    Review of Systems   Review of Systems  Neurological:  Positive for dizziness.    Physical Exam Updated Vital Signs BP 131/77 (BP Location: Right Arm)   Pulse 92   Temp 98 F (36.7 C) (Oral)   Resp 18   Wt 85 kg   SpO2 100%   BMI 22.81 kg/m  Physical Exam Vitals and nursing note reviewed.  Constitutional:      Appearance: He is well-developed.  HENT:     Head: Normocephalic and atraumatic.  Eyes:     Pupils: Pupils are equal, round, and reactive to light.  Neck:     Vascular: No JVD.  Cardiovascular:     Rate and Rhythm: Normal rate and regular rhythm.     Heart sounds: No murmur heard.    No friction rub. No gallop.  Pulmonary:     Effort: No respiratory distress.     Breath sounds: No wheezing.  Abdominal:     General: There is no distension.     Tenderness: There is no abdominal tenderness. There is no guarding or rebound.   Musculoskeletal:        General: Normal range of motion.     Cervical back: Normal range of motion and neck supple.  Skin:    Coloration: Skin is not pale.     Findings: No rash.  Neurological:     Mental Status: He is alert and oriented to person, place, and time.  Psychiatric:        Behavior: Behavior normal.     ED Results / Procedures / Treatments   Labs (all labs ordered are listed, but only abnormal results are displayed) Labs Reviewed  CBC WITH DIFFERENTIAL/PLATELET  COMPREHENSIVE METABOLIC PANEL  MAGNESIUM  TROPONIN I (HIGH SENSITIVITY)    EKG EKG Interpretation  Date/Time:  Tuesday March 05 2023 14:21:25 EDT Ventricular Rate:  97 PR Interval:  127 QRS Duration: 92 QT Interval:  367 QTC Calculation: 444 R Axis:   81 Text Interpretation: Sinus rhythm Paired ventricular premature complexes Left ventricular hypertrophy Abnormal T, consider ischemia, diffuse leads frequent pvc not seen on prior flipped t wave diffusely with large qrs likely lvh Otherwise no significant change Confirmed by Melene Plan [phone]) on 03/05/2023 2:27:40 PM  Radiology No results found.  Procedures Procedures    Medications Ordered in ED Medications  sodium chloride 0.9 % bolus 1,000 mL (1,000 mLs Intravenous New Bag/Given 03/05/23 1444)    ED Course/ Medical Decision Making/ A&P                             Medical Decision Making Amount and/or Complexity of Data Reviewed Labs: ordered. ECG/medicine tests: ordered.   37 yo M with a chief complaint of lightheadedness.  This occurred earlier this morning lasted for a short period of time and now is resolved.  Went to present and was found to be having frequent PVCs.  They did what sounds like orthostatics on him and present and sent him here for evaluation.  He denies any ongoing symptoms.  I am unsure of the cause of his PVCs.  Will obtain a laboratory evaluation to assess for electrolyte abnormality.  Bolus of IV fluids.  Signed  out to Dr. Theresia Lo, please see their note for further details of care in the ED.  Plan for likely D/c if no significant finding and patient remains asymptomatic.  The patients results and plan were reviewed and discussed.   Any x-rays performed were independently reviewed by myself.   Differential diagnosis were considered with the presenting HPI.  Medications  sodium chloride 0.9 % bolus 1,000 mL (1,000 mLs Intravenous New Bag/Given 03/05/23 1444)    Vitals:   03/05/23 1338 03/05/23 1339  BP: 131/77   Pulse: 92   Resp: 18   Temp: 98 F (36.7 C)   TempSrc:  Oral   SpO2: 100%   Weight:  85 kg    Final diagnoses:  Frequent PVCs          Final Clinical Impression(s) / ED Diagnoses Final diagnoses:  Frequent PVCs    Rx / DC Orders ED Discharge Orders     None         Melene Plan, DO 03/05/23 1504

## 2023-04-16 ENCOUNTER — Emergency Department (HOSPITAL_COMMUNITY)

## 2023-04-16 ENCOUNTER — Encounter (HOSPITAL_COMMUNITY): Payer: Self-pay

## 2023-04-16 ENCOUNTER — Emergency Department (HOSPITAL_COMMUNITY)
Admission: EM | Admit: 2023-04-16 | Discharge: 2023-04-17 | Disposition: A | Discharge status: Home / Self Care | Attending: Emergency Medicine | Admitting: Emergency Medicine

## 2023-04-16 DIAGNOSIS — R008 Other abnormalities of heart beat: Secondary | ICD-10-CM

## 2023-04-16 DIAGNOSIS — I493 Ventricular premature depolarization: Secondary | ICD-10-CM | POA: Insufficient documentation

## 2023-04-16 DIAGNOSIS — R079 Chest pain, unspecified: Secondary | ICD-10-CM

## 2023-04-16 DIAGNOSIS — R0789 Other chest pain: Secondary | ICD-10-CM | POA: Diagnosis present

## 2023-04-16 LAB — CBC
HCT: 42.3 % (ref 39.0–52.0)
Hemoglobin: 14 g/dL (ref 13.0–17.0)
MCH: 28.3 pg (ref 26.0–34.0)
MCHC: 33.1 g/dL (ref 30.0–36.0)
MCV: 85.6 fL (ref 80.0–100.0)
Platelets: 237 10*3/uL (ref 150–400)
RBC: 4.94 MIL/uL (ref 4.22–5.81)
RDW: 11.9 % (ref 11.5–15.5)
WBC: 4.2 10*3/uL (ref 4.0–10.5)
nRBC: 0 % (ref 0.0–0.2)

## 2023-04-16 LAB — BASIC METABOLIC PANEL
Anion gap: 10 (ref 5–15)
BUN: 13 mg/dL (ref 6–20)
CO2: 25 mmol/L (ref 22–32)
Calcium: 8.1 mg/dL — ABNORMAL LOW (ref 8.9–10.3)
Chloride: 102 mmol/L (ref 98–111)
Creatinine, Ser: 0.98 mg/dL (ref 0.61–1.24)
GFR, Estimated: >60 mL/min (ref >60)
Glucose, Bld: 103 mg/dL — ABNORMAL HIGH (ref 70–99)
Potassium: 3.8 mmol/L (ref 3.5–5.1)
Sodium: 137 mmol/L (ref 135–145)

## 2023-04-16 LAB — MAGNESIUM: Magnesium: 1.9 mg/dL (ref 1.7–2.4)

## 2023-04-16 LAB — TROPONIN I (HIGH SENSITIVITY)
Troponin I (High Sensitivity): 12 ng/L (ref <18)
Troponin I (High Sensitivity): 15 ng/L (ref <18)

## 2023-04-16 LAB — TSH: TSH: 2.539 u[IU]/mL (ref 0.350–4.500)

## 2023-04-16 MED ORDER — METOPROLOL TARTRATE 25 MG PO TABS: 12.5000 mg | ORAL_TABLET | Freq: Two times a day (BID) | ORAL | 0 refills | Status: DC

## 2023-04-16 MED ORDER — NITROGLYCERIN 0.4 MG SL SUBL
0.4000 mg | SUBLINGUAL_TABLET | SUBLINGUAL | Status: DC | PRN
  Administered 2023-04-16: 0.4 mg via SUBLINGUAL
  Filled 2023-04-16: qty 1

## 2023-04-16 MED ORDER — METOPROLOL TARTRATE 25 MG PO TABS
12.5000 mg | ORAL_TABLET | Freq: Once | ORAL | Status: AC
  Administered 2023-04-16: 12.5 mg via ORAL
  Filled 2023-04-16: qty 1

## 2023-04-16 NOTE — ED Triage Notes (Signed)
[facility] with c/o chest pain and tightness with light headedness and dizziness. HR in 30s with irregular rhythm. Frequent PVCs. 324 mg ASA given by EMS. 250 ml NS given en route. Pt denies cardiac hx.  Trigeminal PVCs on monitor on arrival.

## 2023-04-16 NOTE — ED Notes (Signed)
Pt reports improvement in chest pressure from 6/10 to 4/10 with nitro.  Pt states he wants to just stick with the one nitro for now.  This RN notified pt we can give another to lessen his pain if he would like.

## 2023-04-16 NOTE — ED Provider Notes (Signed)
[place] EMERGENCY DEPARTMENT AT [facility] Provider Note   CSN: [id number] Arrival date & time: 04/16/23  1934     History  Chief Complaint  Patient presents with   Chest Pain    Charles Potts is a 37 y.o. male.  37 year old male who presents emergency department with chest pain, palpitations, and shortness of breath.  Patient reports that at approximately 5:30 PM today he had sudden onset of chest pressure that felt like someone was sitting on his chest.  Not exertional.  Says that it may have been pleuritic.  Did have some shortness of breath.  No cough recently.  No lower extremity swelling or history of DVT or PE.  Not on hormones.  No personal history of cancer.  Did not have any sweating or vomiting during this episode.  No history of syncope.  Brother did have an implanted device that he believes was a pacemaker and died unexpectedly but no other family history of sudden cardiac death or arrhythmias.  Was seen in the emergency department several weeks ago with similar complaints.       Home Medications Prior to Admission medications   Medication Sig Start Date End Date Taking? Authorizing Provider  metoprolol tartrate (LOPRESSOR) 25 MG tablet Take 0.5 tablets (12.5 mg total) by mouth 2 (two) times daily. 04/16/23 05/16/23 Yes Rondel Baton, MD  mirtazapine (REMERON SOL-TAB) 30 MG disintegrating tablet Take 30 mg by mouth at bedtime.   Yes [provider]  dicyclomine (BENTYL) 20 MG tablet Take 1 tablet (20 mg total) by mouth 2 (two) times daily. 03/05/12 03/05/13  Chilton Si, PA-C  HYDROcodone-acetaminophen (NORCO) 5-325 MG per tablet Take 1 tablet by mouth every 6 (six) hours as needed for moderate pain. 01/21/14   Excell Seltzer, Adrian Blackwater, PA-C  ibuprofen (ADVIL,MOTRIN) 800 MG tablet Take 1 tablet (800 mg total) by mouth 3 (three) times daily. 01/21/14   Excell Seltzer, Adrian Blackwater, PA-C  ondansetron (ZOFRAN ODT) 4 MG disintegrating tablet Take 1 tablet (4 mg total)  by mouth every 8 (eight) hours as needed for nausea or vomiting. 01/13/14   Elson Areas, PA-C  oxyCODONE-acetaminophen (PERCOCET/ROXICET) 5-325 MG per tablet Take 2 tablets by mouth every 4 (four) hours as needed for severe pain. 01/13/14   Junious Silk, PA-C  oxyCODONE-acetaminophen (PERCOCET/ROXICET) 5-325 MG per tablet Take 2 tablets by mouth every 4 (four) hours as needed for severe pain. 01/13/14   Elson Areas, PA-C  promethazine (PHENERGAN) 25 MG tablet Take 1 tablet (25 mg total) by mouth every 6 (six) hours as needed for nausea. 02/13/12 02/20/12  Ivery Quale, PA-C  promethazine (PHENERGAN) 25 MG tablet Take 1 tablet (25 mg total) by mouth every 6 (six) hours as needed for nausea or vomiting. 01/13/14   Junious Silk, PA-C  tamsulosin (FLOMAX) 0.4 MG CAPS capsule Take 1 capsule (0.4 mg total) by mouth once. 01/13/14   Elson Areas, PA-C  tamsulosin (FLOMAX) 0.4 MG CAPS capsule Take 1 capsule (0.4 mg total) by mouth daily. 01/21/14   Graylon Good, PA-C  Tamsulosin HCl (FLOMAX) 0.4 MG CAPS Take 1 capsule (0.4 mg total) by mouth daily after supper. 07/30/12   Floydene Flock, MD      Allergies    Demerol [meperidine hcl], Demerol [meperidine], Morphine and codeine, Morphine and codeine, Toradol [ketorolac tromethamine], Toradol [ketorolac tromethamine], Ultram [tramadol], Ultram [tramadol], Zofran, Zofran [ondansetron hcl], Meperidine, Morphine, Morphine and codeine, Toradol [ketorolac tromethamine], and Ultram [tramadol]  Review of Systems   Review of Systems  Physical Exam Updated Vital Signs BP 130/76   Pulse (!) 34   Temp 98.3 F (36.8 C) (Oral)   Resp 17   SpO2 100%  Physical Exam Vitals and nursing note reviewed.  Constitutional:      General: He is not in acute distress.    Appearance: He is well-developed.  HENT:     Head: Normocephalic and atraumatic.     Right Ear: External ear normal.     Left Ear: External ear normal.     Nose: Nose normal.  Eyes:      Extraocular Movements: Extraocular movements intact.     Conjunctiva/sclera: Conjunctivae normal.     Pupils: Pupils are equal, round, and reactive to light.  Cardiovascular:     Rate and Rhythm: Normal rate. Rhythm irregular.     Heart sounds: Normal heart sounds.  Pulmonary:     Effort: Pulmonary effort is normal. No respiratory distress.     Breath sounds: Normal breath sounds.  Abdominal:     Palpations: Abdomen is soft.  Musculoskeletal:     Cervical back: Normal range of motion and neck supple.     Right lower leg: No edema.     Left lower leg: No edema.  Skin:    General: Skin is warm and dry.  Neurological:     Mental Status: He is alert. Mental status is at baseline.  Psychiatric:        Mood and Affect: Mood normal.        Behavior: Behavior normal.     ED Results / Procedures / Treatments   Labs (all labs ordered are listed, but only abnormal results are displayed) Labs Reviewed  BASIC METABOLIC PANEL - Abnormal; Notable for the following components:      Result Value   Glucose, Bld 103 (*)    Calcium 8.1 (*)    All other components within normal limits  CBC  MAGNESIUM  TSH  TROPONIN I (HIGH SENSITIVITY)  TROPONIN I (HIGH SENSITIVITY)    EKG EKG Interpretation  Date/Time:  Tuesday Apr 16 2023 19:41:01 EDT Ventricular Rate:  87 PR Interval:  178 QRS Duration: 90 QT Interval:  395 QTC Calculation: 476 R Axis:   87 Text Interpretation: Sinus rhythm Ventricular trigeminy Probable left ventricular hypertrophy Nonspecific T abnormalities, lateral leads Borderline prolonged QT interval Confirmed by Vonita Moss [phone]) on 04/16/2023 8:43:16 PM  Radiology DG Chest Portable 1 View  Result Date: 04/16/2023 CLINICAL DATA:  cp EXAM: PORTABLE CHEST - 1 VIEW COMPARISON:  02/13/2012 FINDINGS: Lungs are clear. Heart size and mediastinal contours are within normal limits. No effusion.  No pneumothorax. Visualized bones unremarkable. IMPRESSION: No acute  cardiopulmonary disease. Electronically Signed   By: Corlis Leak M.D.   On: 04/16/2023 20:22    Procedures Procedures    Medications Ordered in ED Medications  nitroGLYCERIN (NITROSTAT) SL tablet 0.4 mg (0.4 mg Sublingual Given 04/16/23 2055)  metoprolol tartrate (LOPRESSOR) tablet 12.5 mg (12.5 mg Oral Given 04/16/23 2213)    ED Course/ Medical Decision Making/ A&P Clinical Course as of 04/16/23 2358  Tue Apr 16, 2023  2130 Discussed with Dr Juel Burrow from cardiology.  [RP]    Clinical Course User Index [RP] Rondel Baton, MD                             Medical Decision Making Amount and/or Complexity of  Data Reviewed Labs: ordered. Radiology: ordered.  Risk Prescription drug management.   Charles Potts is a 37 y.o. male with comorbidities that complicate the patient evaluation including with a history of PVCs who presents with chest pain and palpitations  Initial Ddx:  MI, PE, pneumonia, dissection, PVCs/arrhythmia  MDM:  With the patient's chest discomfort will obtain EKG and troponins to evaluate for MI.  Also considering pulmonary embolism but patient is PERC negative.  Considered dissection but with their symmetric pulses, history, and description of the pain feel it is less likely.  If chest x-ray reveals widened mediastinum or any other concerning findings will consider CTA.  No infectious symptoms to suggest pneumonia at this time that would be causing pleuritic chest pain.  Patient is throwing frequent PVCs so we will discuss with cardiology as I suspect that these are likely related to the symptoms.  Plan:  Labs Troponin D-dimer considered but not ordered because patient is PE RC negative EKG Chest x-ray  ED Summary/Re-evaluation:  Patient's workup returned and was unremarkable.  Was having frequent ectopy.  Cardiology recommended starting the patient on 12.5 mg of Lopressor twice daily and outpatient follow-up for echo and Holter monitor.  Serial troponins  unremarkable.  EKG without any ischemic changes.  Ambulatory referral placed and patient was given information for cardiology.  Also given a prescription for Lopressor.  This patient presents to the ED for concern of complaints listed in HPI, this involves an extensive number of treatment options, and is a complaint that carries with it a high risk of complications and morbidity. Disposition including potential need for admission considered.   Dispo: DC to Facility  Records reviewed Outpatient Clinic Notes The following labs were independently interpreted: Chemistry and show no acute abnormality I independently reviewed the following imaging with scope of interpretation limited to determining acute life threatening conditions related to emergency care: Chest x-ray and agree with the radiologist interpretation with the following exceptions: none I personally reviewed and interpreted cardiac monitoring:  sinus rhythm with frequent PVCs I personally reviewed and interpreted the pt's EKG: see above for interpretation  I have reviewed the patients home medications and made adjustments as needed Consults: Cardiology Social Determinants of health:  [facility] pt       Final Clinical Impression(s) / ED Diagnoses Final diagnoses:  PVC's (premature ventricular contractions)  Chest pain, unspecified type    Rx / DC Orders ED Discharge Orders          Ordered    Ambulatory referral to Cardiology        04/16/23 2250    metoprolol tartrate (LOPRESSOR) 25 MG tablet  2 times daily        04/16/23 2320              Rondel Baton, MD 04/16/23 2359

## 2023-04-16 NOTE — Consult Note (Signed)
Cardiology Consultation   Patient ID: Charles Potts MRN: [id number]; DOB: 08/14/1986  Admit date: 04/16/2023 Date of Consult: 04/16/2023  PCP:  Patient, No Pcp Per   [facility] Providers Cardiologist:  None        Patient Profile:   Charles Potts is a 37 y.o. male with a hx of diverticulitis, s/p appendectomy and cholecystectomy, who is being seen 04/16/2023 for the evaluation of dizziness f/t/h ventricular trigeminy.  History of Present Illness:   Charles Potts is a 37 y.o. male with a hx of diverticulitis, s/p appendectomy and cholecystectomy, who is being seen 04/16/2023 for the evaluation of dizziness f/t/h ventricular trigeminy. Cardiology consulted for ventriclar trigeminy.  Was in his usual state of health until dinner time today. He finished his food and left to go use the bathroom, as he was standing at the toilet started feeling some dizziness. Eventually he started appreciating some chest pressure as well so he then decided to go to medical to be evaluated. He was seen there and sent to the ED for further evaluation. He otherwise denies orthopnea, PND, BLE edema, palpitatations, sensations of skipped beats, SOB/DOE, fevers, chills, syncope, presyncope.  In the ED, BP 130-140s/60s, HR auto-recorded in the 30-70s (but in ventricular trigeminy), no note of bradycardia on review of telemetry. Labs WNL with Cr stable at 0.98 and TSH normal at 2.5. Initial hs-troponin 12, repeat pending. EKG and telemetry showed that he was in ventricular trigeminy.  Presented in 02/2023 with complaints of dizziness as well, back then EKG showed ventricular bigeminy. His symptoms had resolved and labs were WNL so he was discharged from the ED. Has not seen a cardiologist, had a TTE, or had a cardiac monitor yet.  No personal history of syncope or presyncope. No family history of sudden cardiac death or unexplained car accidents. He does have a brother who he notes had congenital heart  disease, valve replacement surgery, and then eventually passed away from endocarditis. He does not have any heart disease that he knows of.  He was started on metoprolol and now feels back to normal without dizziness anymore and chest pressure has resolved.   Past Medical History:  Diagnosis Date   Diverticulitis    Diverticulosis    Kidney calculi    Kidney stone    Renal disorder     Past Surgical History:  Procedure Laterality Date   APPENDECTOMY     CHOLECYSTECTOMY       Home Medications:  Prior to Admission medications   Medication Sig Start Date End Date Taking? Authorizing Provider  mirtazapine (REMERON SOL-TAB) 30 MG disintegrating tablet Take 30 mg by mouth at bedtime.   Yes [provider]  dicyclomine (BENTYL) 20 MG tablet Take 1 tablet (20 mg total) by mouth 2 (two) times daily. 03/05/12 03/05/13  Chilton Si, PA-C  HYDROcodone-acetaminophen (NORCO) 5-325 MG per tablet Take 1 tablet by mouth every 6 (six) hours as needed for moderate pain. 01/21/14   Excell Seltzer, Adrian Blackwater, PA-C  ibuprofen (ADVIL,MOTRIN) 800 MG tablet Take 1 tablet (800 mg total) by mouth 3 (three) times daily. 01/21/14   Excell Seltzer, Adrian Blackwater, PA-C  ondansetron (ZOFRAN ODT) 4 MG disintegrating tablet Take 1 tablet (4 mg total) by mouth every 8 (eight) hours as needed for nausea or vomiting. 01/13/14   Elson Areas, PA-C  oxyCODONE-acetaminophen (PERCOCET/ROXICET) 5-325 MG per tablet Take 2 tablets by mouth every 4 (four) hours as needed for severe pain. 01/13/14   Junious Silk,  PA-C  oxyCODONE-acetaminophen (PERCOCET/ROXICET) 5-325 MG per tablet Take 2 tablets by mouth every 4 (four) hours as needed for severe pain. 01/13/14   Elson Areas, PA-C  promethazine (PHENERGAN) 25 MG tablet Take 1 tablet (25 mg total) by mouth every 6 (six) hours as needed for nausea. 02/13/12 02/20/12  Ivery Quale, PA-C  promethazine (PHENERGAN) 25 MG tablet Take 1 tablet (25 mg total) by mouth every 6 (six) hours as needed  for nausea or vomiting. 01/13/14   Junious Silk, PA-C  tamsulosin (FLOMAX) 0.4 MG CAPS capsule Take 1 capsule (0.4 mg total) by mouth once. 01/13/14   Elson Areas, PA-C  tamsulosin (FLOMAX) 0.4 MG CAPS capsule Take 1 capsule (0.4 mg total) by mouth daily. 01/21/14   Graylon Good, PA-C  Tamsulosin HCl (FLOMAX) 0.4 MG CAPS Take 1 capsule (0.4 mg total) by mouth daily after supper. 07/30/12   Floydene Flock, MD    Inpatient Medications: Scheduled Meds:  Continuous Infusions:  PRN Meds: nitroGLYCERIN  Allergies:    Allergies  Allergen Reactions   Demerol [Meperidine Hcl] Hives   Demerol [Meperidine] Hives   Morphine And Codeine     Made his arm itchy, RN told him it may have been pushed to fast.   Morphine And Codeine    Toradol [Ketorolac Tromethamine] Hives   Toradol [Ketorolac Tromethamine]    Ultram [Tramadol] Hives   Ultram [Tramadol]    Zofran     Right side of face swells   Zofran [Ondansetron Hcl]     Made his arm itchy. Given at the same time as the Morphine, RN told him that the Morphine may have been pushed to fast.   Meperidine Rash   Morphine Rash    Can take Dilaudid w/o issue   Morphine And Codeine Hives   Toradol [Ketorolac Tromethamine] Rash   Ultram [Tramadol] Rash    Social History:   Social History   Socioeconomic History   Marital status: Single    Spouse name: Not on file   Number of children: Not on file   Years of education: Not on file   Highest education level: Not on file  Occupational History   Not on file  Tobacco Use   Smoking status: Never   Smokeless tobacco: Not on file  Substance and Sexual Activity   Alcohol use: No   Drug use: No   Sexual activity: Not on file  Other Topics Concern   Not on file  Social History Narrative   ** Merged History Encounter **       ** Data from: 02/25/14 Enc Dept: [facility]   ** Merged History Encounter **           ** Data from: 02/25/14 Enc Dept: [facility]   **  Merged History Encounter **       Social Determinants of Health   Financial Resource Strain: Not on file  Food Insecurity: Not on file  Transportation Needs: Not on file  Physical Activity: Not on file  Stress: Not on file  Social Connections: Not on file  Intimate Partner Violence: Not on file    Family History:   Family History  Problem Relation Age of Onset   Hypertension Mother    Hypertension Father    Mental retardation Brother     ROS:  Please see the history of present illness.  All other ROS reviewed and negative.     Physical Exam/Data:   Vitals:  04/16/23 2145 04/16/23 2200 04/16/23 2215 04/16/23 2230  BP:  134/69 (!) 140/68 137/70  Pulse: (!) 39 77  (!) 32  Resp: 16 (!) 22 (!) 29 (!) 22  Temp:      TempSrc:      SpO2: 100% 100% 100% 100%   No intake or output data in the 24 hours ending 04/16/23 2304    03/05/2023    1:39 PM 12/23/2013    5:21 PM 07/30/2012    7:26 PM  Last 3 Weights  Weight (lbs) 187 lb 6.3 oz 188 lb 190 lb  Weight (kg) 85 kg 85.276 kg 86.183 kg     There is no height or weight on file to calculate BMI.  General:  Laying flat in bed in NAD HEENT: normal Neck: no JVD Vascular: No carotid bruits; Distal pulses 2+ bilaterally Cardiac:  normal S1, S2; irregular rhythm; no murmur Lungs:  clear to auscultation bilaterally, no wheezing, rhonchi or rales  Abd: soft, nontender, no hepatomegaly  Ext: no edema Musculoskeletal:  No deformities, BUE and BLE strength normal and equal Skin: warm and dry  Neuro:  grossly alert and oriented, responding appropriately to questions, moving all extremities spontaneously Psych:  Normal affect   EKG:  The EKG was personally reviewed and demonstrates:  ventricular trigeminy, 2 different morphologies of PVC Telemetry:  Telemetry was personally reviewed and demonstrates:  ventricular trigeminy  Relevant CV Studies: N/a  Laboratory Data:  High Sensitivity Troponin:   Recent Labs  Lab 04/16/23 2028   TROPONINIHS 12     Chemistry Recent Labs  Lab 04/16/23 2028  NA 137  K 3.8  CL 102  CO2 25  GLUCOSE 103*  BUN 13  CREATININE 0.98  CALCIUM 8.1*  MG 1.9  GFRNONAA >60  ANIONGAP 10    No results for input(s): "PROT", "ALBUMIN", "AST", "ALT", "ALKPHOS", "BILITOT" in the last 168 hours. Lipids No results for input(s): "CHOL", "TRIG", "HDL", "LABVLDL", "LDLCALC", "CHOLHDL" in the last 168 hours.  Hematology Recent Labs  Lab 04/16/23 2028  WBC 4.2  RBC 4.94  HGB 14.0  HCT 42.3  MCV 85.6  MCH 28.3  MCHC 33.1  RDW 11.9  PLT 237   Thyroid  Recent Labs  Lab 04/16/23 2028  TSH 2.539    BNPNo results for input(s): "BNP", "PROBNP" in the last 168 hours.  DDimer No results for input(s): "DDIMER" in the last 168 hours.   Radiology/Studies:  DG Chest Portable 1 View  Result Date: 04/16/2023 CLINICAL DATA:  cp EXAM: PORTABLE CHEST - 1 VIEW COMPARISON:  02/13/2012 FINDINGS: Lungs are clear. Heart size and mediastinal contours are within normal limits. No effusion.  No pneumothorax. Visualized bones unremarkable. IMPRESSION: No acute cardiopulmonary disease. Electronically Signed   By: Corlis Leak M.D.   On: 04/16/2023 20:22     Assessment and Plan:   Azriel Mateen is a 37 y.o. male with a hx of diverticulitis, s/p appendectomy and cholecystectomy, who is being seen 04/16/2023 for the evaluation of dizziness f/t/h ventricular trigeminy. Cardiology consulted for ventriclar trigeminy.  Had a similar presentation for dizziness in 02/2023 and found to be in ventricular bigeminy at that time. 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 We have started him on metoprolol tartrate with resolution of his symptoms and he should continue this medication at discharge. He has no symptoms or physical exam findings concerning  for heart failure and with his normal labs should be  safe for discharge with continued outpatient workup. Would recommend an ambulatory referral with cardiology to ensure that his symptoms stay resolved on metoprolol and consider a TTE for formal cardiac assessment as well as a cardiac monitor to quantify overall PVC burden.  Recommendations: - START metoprolol tartrate 12.5mg  BID - Please place an ambulatory referral to cardiology. They can assist with ordering a TTE, cardiac monitor, and uptitrating metoprolol as needed - 1st hs-troponin 12, f/u pending repeat hs-troponin, please re-engage [place] if troponins rising  Risk Assessment/Risk Scores:       [facility] will sign off.   Medication Recommendations:  Metoprolol tartrate Other recommendations (labs, testing, etc):  f/u pending repeat troponin Follow up as an outpatient:  please place ambulatory referral to cardiology  For questions or updates, please contact  [facility] Please consult [email or web address] for contact info under    Signed, Bella Kennedy, MD  04/16/2023 11:04 PM

## 2023-04-16 NOTE — Discharge Instructions (Signed)
You were seen for your chest pain and palpitations in the emergency department.   At home, please start taking the metoprolol that we have prescribed you.    Follow-up with your primary doctor in 2-3 days regarding your visit.  Follow-up with cardiology in the next 1 to 2 weeks and talk to them about a Holter monitor.  Cardiology will be calling you regarding an appointment within the next 72 hours.  You may contact them if you do not hear from them in that time using the information in this packet.  Return immediately to the emergency department if you experience any of the following: Worsening pain, difficulty breathing, unexplained vomiting or sweating, or any other concerning symptoms.    Thank you for visiting our Emergency Department. It was a pleasure taking care of you today.

## 2023-04-17 NOTE — ED Notes (Signed)
Pt provided with AVS.  Education complete; all questions answered.  Pt leaving ED in stable condition at this time, ambulatory with all belongings.  Pt leaving with detention center officers.

## 2023-06-10 ENCOUNTER — Ambulatory Visit: Attending: Internal Medicine | Admitting: Internal Medicine

## 2023-06-10 ENCOUNTER — Ambulatory Visit (INDEPENDENT_AMBULATORY_CARE_PROVIDER_SITE_OTHER)

## 2023-06-10 ENCOUNTER — Encounter: Payer: Self-pay | Admitting: Internal Medicine

## 2023-06-10 VITALS — BP 140/84 | HR 28 | Ht 75.0 in | Wt 195.6 lb

## 2023-06-10 DIAGNOSIS — I493 Ventricular premature depolarization: Secondary | ICD-10-CM

## 2023-06-10 MED ORDER — METOPROLOL SUCCINATE ER 25 MG PO TB24: 25.0000 mg | ORAL_TABLET | Freq: Every day | ORAL | 3 refills | Status: DC

## 2023-06-10 NOTE — Patient Instructions (Addendum)
Medication Instructions:  Your physician has recommended you make the following change in your medication:  STOP: Propranolol START: Metoprolol succinate (Toprol-XL)  25 mg once daily *If you need a refill on your cardiac medications before your next appointment, please call your pharmacy*   Lab Work: None   Testing/Procedures: Your physician has requested that you have an echocardiogram. Echocardiography is a painless test that uses sound waves to create images of your heart. It provides your doctor with information about the size and shape of your heart and how well your heart's chambers and valves are working. This procedure takes approximately one hour. There are no restrictions for this procedure. Please do NOT wear cologne, perfume, aftershave, or lotions (deodorant is allowed). Please arrive 15 minutes prior to your appointment time.   ZIO XT- Long Term Monitor Instructions  Your physician has requested you wear a ZIO patch monitor for 3 days.  This is a single patch monitor. [employer] supplies one patch monitor per enrollment. Additional stickers are not available. Please do not apply patch if you will be having a Nuclear Stress Test,  Echocardiogram, Cardiac CT, MRI, or Chest Xray during the period you would be wearing the  monitor. The patch cannot be worn during these tests. You cannot remove and re-apply the  ZIO XT patch monitor.  Your ZIO patch monitor will be mailed 3 day USPS to your address on file. It may take 3-5 days  to receive your monitor after you have been enrolled.  Once you have received your monitor, please review the enclosed instructions. Your monitor  has already been registered assigning a specific monitor serial # to you.  Billing and Patient Assistance Program Information  We have supplied [employer] with any of your insurance information on file for billing purposes. [employer] offers a sliding scale Patient Assistance Program for patients that do not have   insurance, or whose insurance does not completely cover the cost of the ZIO monitor.  You must apply for the Patient Assistance Program to qualify for this discounted rate.  To apply, please call [employer] at [phone], select option 4, select option 2, ask to apply for  Patient Assistance Program. Meredeth Ide will ask your household income, and how many people  are in your household. They will quote your out-of-pocket cost based on that information.  [employer] will also be able to set up a 41-month, interest-free payment plan if needed.  Applying the monitor   Shave hair from upper left chest.  Hold abrader disc by orange tab. Rub abrader in 40 strokes over the upper left chest as  indicated in your monitor instructions.  Clean area with 4 enclosed alcohol pads. Let dry.  Apply patch as indicated in monitor instructions. Patch will be placed under collarbone on left  side of chest with arrow pointing upward.  Rub patch adhesive wings for 2 minutes. Remove white label marked "1". Remove the white  label marked "2". Rub patch adhesive wings for 2 additional minutes.  While looking in a mirror, press and release button in center of patch. A small green light will  flash 3-4 times. This will be your only indicator that the monitor has been turned on.  Do not shower for the first 24 hours. You may shower after the first 24 hours.  Press the button if you feel a symptom. You will hear a small click. Record Date, Time and  Symptom in the Patient Logbook.  When you are ready to remove the patch,  follow instructions on the last 2 pages of Patient  Logbook. Stick patch monitor onto the last page of Patient Logbook.  Place Patient Logbook in the blue and white box. Use locking tab on box and tape box closed  securely. The blue and white box has prepaid postage on it. Please place it in the mailbox as  soon as possible. Your physician should have your test results approximately 7 days after the  monitor  has been mailed back to [facility].  Call [facility] Customer Care at [phone] if you have questions regarding  your ZIO XT patch monitor. Call them immediately if you see an orange light blinking on your  monitor.  If your monitor falls off in less than 4 days, contact our Monitor department at [phone].  If your monitor becomes loose or falls off after 4 days call [employer] at [phone] for  suggestions on securing your monitor    Follow-Up: At [facility], you and your health needs are our priority.  As part of our continuing mission to provide you with exceptional heart care, we have created designated Provider Care Teams.  These Care Teams include your primary Cardiologist (physician) and Advanced Practice Providers (APPs -  Physician Assistants and Nurse Practitioners) who all work together to provide you with the care you need, when you need it.  We recommend signing up for the patient portal called "MyChart".  Sign up information is provided on this After Visit Summary.  MyChart is used to connect with patients for Virtual Visits (Telemedicine).  Patients are able to view lab/test results, encounter notes, upcoming appointments, etc.  Non-urgent messages can be sent to your provider as well.   To learn more about what you can do with MyChart, go to [email or web address].    Your next appointment:   6 month(s)  Provider:   None

## 2023-06-10 NOTE — Progress Notes (Unsigned)
Cardiology Office Note:    Date:  06/10/2023   ID:  Charles Potts, DOB [birth year], MRN [id number]  PCP:  Patient, No Pcp Per   [facility] Providers Cardiologist:  None { Click to update primary MD,subspecialty MD or APP then REFRESH:1}    Referring MD: Rondel Baton, MD   No chief complaint on file. PVCs  History of Present Illness:    Charles Potts is a 37 y.o. male with no significant pmhx. He went to the ED note and per their documentation: " Patient reports that at approximately 5:30 PM today he had sudden onset of chest pressure that felt like someone was sitting on his chest. Not exertional. " Possible pleuritic. EKG showed sinus with RVOT PVCs.  ACS was ruled out. Brother died unexpectedly and had a device.   ***Charles Potts presents for a follow-up visit after a recent emergency room visit due to chest pain. He reports that his chest pain has improved and he is currently feeling okay. He has been informed about his EKG results, which showed skipped heartbeats, but has limited understanding of the implications. The patient is currently on propranolol for the skipped heartbeats.  Charles Potts has a family history of sudden cardiac death, as his brother passed away suddenly at a young age (4s-30s) due to a blood infection and had a pacemaker. The patient is unsure of the exact reason for his brother's pacemaker and multiple heart surgeries but recalls something about a heart leakage. His parents have no known heart-related disorders, but they have a history of blood pressure issues.  The patient denies any history of fainting or loss of consciousness and reports being able to exercise without experiencing chest pain, shortness of breath, or lightheadedness. His blood pressure is slightly elevated, but he has never taken any medication for it.  Past Medical History:  Diagnosis Date   Diverticulitis    Diverticulosis    Kidney calculi    Kidney stone    Renal  disorder     Past Surgical History:  Procedure Laterality Date   APPENDECTOMY     CHOLECYSTECTOMY      Current Medications: No outpatient medications have been marked as taking for the 06/10/23 encounter (Office Visit) with Maisie Fus, MD.     Allergies:   Demerol [meperidine hcl], Demerol [meperidine], Morphine and codeine, Morphine and codeine, Toradol [ketorolac tromethamine], Toradol [ketorolac tromethamine], Ultram [tramadol], Ultram [tramadol], Zofran, Zofran [ondansetron hcl], Meperidine, Morphine, Morphine and codeine, Toradol [ketorolac tromethamine], and Ultram [tramadol]   Social History   Socioeconomic History   Marital status: Single    Spouse name: Not on file   Number of children: Not on file   Years of education: Not on file   Highest education level: Not on file  Occupational History   Not on file  Tobacco Use   Smoking status: Never   Smokeless tobacco: Not on file  Substance and Sexual Activity   Alcohol use: No   Drug use: No   Sexual activity: Not on file  Other Topics Concern   Not on file  Social History Narrative   ** Merged History Encounter **       ** Data from: 02/25/14 Enc Dept: [facility]   ** Merged History Encounter **           ** Data from: 02/25/14 Enc Dept: [facility]   ** Merged History Encounter **       Social Determinants of Health  Financial Resource Strain: Not on file  Food Insecurity: Not on file  Transportation Needs: Not on file  Physical Activity: Not on file  Stress: Not on file  Social Connections: Not on file     Family History: The patient's ***family history includes Hypertension in his father and mother; Mental retardation in his brother.  ROS:   Please see the history of present illness.    *** All other systems reviewed and are negative.  EKGs/Labs/Other Studies Reviewed:    The following studies were reviewed today: ***      Recent Labs: 03/05/2023: ALT 23 04/16/2023: BUN 13;  Creatinine, Ser 0.98; Hemoglobin 14.0; Magnesium 1.9; Platelets 237; Potassium 3.8; Sodium 137; TSH 2.539  Recent Lipid Panel No results found for: "CHOL", "TRIG", "HDL", "CHOLHDL", "VLDL", "LDLCALC", "LDLDIRECT"   Risk Assessment/Calculations:   {Does this patient have ATRIAL FIBRILLATION?[phone]}  No BP recorded.  {Refresh Note OR Click here to enter BP  :1}***         Physical Exam:    VS:  There were no vitals taken for this visit.    Wt Readings from Last 3 Encounters:  07/30/12 190 lb (86.2 kg)  07/14/12 190 lb (86.2 kg)  05/07/12 191 lb (86.6 kg)     GEN: *** Well nourished, well developed in no acute distress HEENT: Normal NECK: No JVD; No carotid bruits LYMPHATICS: No lymphadenopathy CARDIAC: ***RRR, no murmurs, rubs, gallops RESPIRATORY:  Clear to auscultation without rales, wheezing or rhonchi  ABDOMEN: Soft, non-tender, non-distended MUSCULOSKELETAL:  No edema; No deformity  SKIN: Warm and dry NEUROLOGIC:  Alert and oriented x 3 PSYCHIATRIC:  Normal affect   ASSESSMENT:   RVOT  1. Premature ventricular contraction   Elevated BP 140/84 mmHg PLAN:    In order of problems listed above:  Change propanolol 40 mg daily to metop 25 mg XL TTE 3 day ziopatch Follow up in 6 months           Medication Adjustments/Labs and Tests Ordered: Current medicines are reviewed at length with the patient today.  Concerns regarding medicines are outlined above.  Orders Placed This Encounter  Procedures   EKG 12-Lead   No orders of the defined types were placed in this encounter.   There are no Patient Instructions on file for this visit.   Signed, Maisie Fus, MD  06/10/2023 8:53 AM    [facility]

## 2023-06-10 NOTE — Progress Notes (Unsigned)
Enrolled for [employer] to mail a ZIO XT long term holter monitor to the patients address on file.  

## 2023-06-21 DIAGNOSIS — I493 Ventricular premature depolarization: Secondary | ICD-10-CM

## 2023-07-08 ENCOUNTER — Other Ambulatory Visit: Payer: Self-pay

## 2023-07-08 DIAGNOSIS — I493 Ventricular premature depolarization: Secondary | ICD-10-CM

## 2023-07-08 MED ORDER — METOPROLOL SUCCINATE ER 50 MG PO TB24
25.0000 mg | ORAL_TABLET | Freq: Every day | ORAL | 3 refills | Status: AC
Start: 2023-07-08 — End: ?

## 2023-07-17 ENCOUNTER — Telehealth (HOSPITAL_COMMUNITY): Payer: Self-pay | Admitting: Internal Medicine

## 2023-07-17 NOTE — Telephone Encounter (Signed)
Patient was ordered an echocardiogram at last office visit. He was an inmate at that time and per order they were to call and schedule. This was not done. I called to schedule with [facility] and patient is no longer an inmate there confirmed thru automated system.  There are no other contacts in patients chart and not signed up with MY CHART. Order will be removed from the active echo WQ. Thank you

## 2023-10-22 ENCOUNTER — Encounter: Payer: Self-pay | Admitting: *Deleted
